# Patient Record
Sex: Female | Born: 1970
Health system: Southern US, Community
[De-identification: ages and names within clinical notes are randomized; demographics above are authoritative.]

## PROBLEM LIST (undated history)

## (undated) DIAGNOSIS — T7840XA Allergy, unspecified, initial encounter: Secondary | ICD-10-CM

## (undated) DIAGNOSIS — F419 Anxiety disorder, unspecified: Secondary | ICD-10-CM

## (undated) DIAGNOSIS — D649 Anemia, unspecified: Secondary | ICD-10-CM

## (undated) DIAGNOSIS — F32A Depression, unspecified: Secondary | ICD-10-CM

## (undated) DIAGNOSIS — Z803 Family history of malignant neoplasm of breast: Secondary | ICD-10-CM

## (undated) DIAGNOSIS — Z800 Family history of malignant neoplasm of digestive organs: Secondary | ICD-10-CM

## (undated) DIAGNOSIS — F329 Major depressive disorder, single episode, unspecified: Secondary | ICD-10-CM

## (undated) HISTORY — DX: Depression, unspecified: F32.A

## (undated) HISTORY — DX: Anxiety disorder, unspecified: F41.9

## (undated) HISTORY — DX: Major depressive disorder, single episode, unspecified: F32.9

## (undated) HISTORY — DX: Allergy, unspecified, initial encounter: T78.40XA

## (undated) HISTORY — DX: Anemia, unspecified: D64.9

## (undated) HISTORY — DX: Family history of malignant neoplasm of digestive organs: Z80.0

## (undated) HISTORY — DX: Family history of malignant neoplasm of breast: Z80.3

---

## 2001-11-01 ENCOUNTER — Other Ambulatory Visit: Admission: RE | Admit: 2001-11-01 | Discharge: 2001-11-01 | Payer: Self-pay | Admitting: *Deleted

## 2003-04-02 ENCOUNTER — Encounter: Admission: RE | Admit: 2003-04-02 | Discharge: 2003-04-02 | Payer: Self-pay

## 2004-12-29 ENCOUNTER — Other Ambulatory Visit: Admission: RE | Admit: 2004-12-29 | Discharge: 2004-12-29 | Payer: Self-pay | Admitting: Family Medicine

## 2007-02-15 ENCOUNTER — Encounter: Admission: RE | Admit: 2007-02-15 | Discharge: 2007-02-15 | Payer: Self-pay | Admitting: Family Medicine

## 2007-08-10 ENCOUNTER — Encounter: Admission: RE | Admit: 2007-08-10 | Discharge: 2007-08-10 | Payer: Self-pay | Admitting: Family Medicine

## 2008-11-12 ENCOUNTER — Other Ambulatory Visit: Admission: RE | Admit: 2008-11-12 | Discharge: 2008-11-12 | Payer: Self-pay | Admitting: Family Medicine

## 2010-04-06 ENCOUNTER — Other Ambulatory Visit: Admission: RE | Admit: 2010-04-06 | Discharge: 2010-04-06 | Payer: Self-pay | Admitting: Family Medicine

## 2015-07-20 ENCOUNTER — Ambulatory Visit (INDEPENDENT_AMBULATORY_CARE_PROVIDER_SITE_OTHER): Payer: 59 | Admitting: Emergency Medicine

## 2015-07-20 VITALS — BP 108/58 | HR 51 | Temp 98.1°F | Resp 18 | Ht 66.5 in | Wt 191.2 lb

## 2015-07-20 DIAGNOSIS — F329 Major depressive disorder, single episode, unspecified: Secondary | ICD-10-CM | POA: Diagnosis not present

## 2015-07-20 DIAGNOSIS — F4323 Adjustment disorder with mixed anxiety and depressed mood: Secondary | ICD-10-CM

## 2015-07-20 DIAGNOSIS — F32A Depression, unspecified: Secondary | ICD-10-CM

## 2015-07-20 DIAGNOSIS — Z0000 Encounter for general adult medical examination without abnormal findings: Secondary | ICD-10-CM | POA: Diagnosis not present

## 2015-07-20 DIAGNOSIS — L989 Disorder of the skin and subcutaneous tissue, unspecified: Secondary | ICD-10-CM | POA: Diagnosis not present

## 2015-07-20 DIAGNOSIS — E049 Nontoxic goiter, unspecified: Secondary | ICD-10-CM | POA: Diagnosis not present

## 2015-07-20 DIAGNOSIS — Z13220 Encounter for screening for lipoid disorders: Secondary | ICD-10-CM

## 2015-07-20 DIAGNOSIS — R001 Bradycardia, unspecified: Secondary | ICD-10-CM | POA: Diagnosis not present

## 2015-07-20 LAB — COMPLETE METABOLIC PANEL WITH GFR
ALBUMIN: 4.3 g/dL (ref 3.6–5.1)
ALK PHOS: 64 U/L (ref 33–115)
ALT: 9 U/L (ref 6–29)
AST: 11 U/L (ref 10–35)
BUN: 17 mg/dL (ref 7–25)
CALCIUM: 9 mg/dL (ref 8.6–10.2)
CO2: 28 mmol/L (ref 20–31)
CREATININE: 0.86 mg/dL (ref 0.50–1.10)
Chloride: 102 mmol/L (ref 98–110)
GFR, EST NON AFRICAN AMERICAN: 82 mL/min (ref >=60)
Glucose, Bld: 90 mg/dL (ref 65–99)
Potassium: 5 mmol/L (ref 3.5–5.3)
SODIUM: 136 mmol/L (ref 135–146)
TOTAL PROTEIN: 6.7 g/dL (ref 6.1–8.1)
Total Bilirubin: 0.4 mg/dL (ref 0.2–1.2)

## 2015-07-20 LAB — LIPID PANEL
Cholesterol: 195 mg/dL (ref 125–200)
HDL: 51 mg/dL (ref >=46)
LDL CALC: 129 mg/dL (ref <130)
Total CHOL/HDL Ratio: 3.8 Ratio (ref <=5.0)
Triglycerides: 76 mg/dL (ref <150)
VLDL: 15 mg/dL (ref <30)

## 2015-07-20 LAB — POCT CBC
GRANULOCYTE PERCENT: 59.9 % (ref 37–80)
HCT, POC: 41.3 % (ref 37.7–47.9)
HEMOGLOBIN: 14.3 g/dL (ref 12.2–16.2)
Lymph, poc: 2 (ref 0.6–3.4)
MCH: 30.6 pg (ref 27–31.2)
MCHC: 34.5 g/dL (ref 31.8–35.4)
MCV: 88.5 fL (ref 80–97)
MID (cbc): 0.4 (ref 0–0.9)
MPV: 7.5 fL (ref 0–99.8)
PLATELET COUNT, POC: 247 10*3/uL (ref 142–424)
POC Granulocyte: 3.7 (ref 2–6.9)
POC LYMPH %: 33.2 % (ref 10–50)
POC MID %: 6.9 %M (ref 0–12)
RBC: 4.66 M/uL (ref 4.04–5.48)
RDW, POC: 13.3 %
WBC: 6.1 10*3/uL (ref 4.6–10.2)

## 2015-07-20 LAB — TSH: TSH: 2.1 u[IU]/mL (ref 0.350–4.500)

## 2015-07-20 NOTE — Progress Notes (Signed)
Patient ID: Sylvia Meyer, female   DOB: 07/21/1970, 45 y.o.   MRN: [id number]     By signing my name below, I, Zola Button, attest that this documentation has been prepared under the direction and in the presence of Arlyss Queen, MD.  Electronically Signed: Zola Button, Medical Scribe. 07/20/2015. 9:19 AM.   Chief Complaint:  Chief Complaint  Patient presents with  . Annual Exam    School Physical    HPI: Sylvia Meyer is a 45 y.o. female who reports to [facility] today for a physical exam for school. 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  Patient had her gyn exams in the past few months, but she has not had recent blood work.  Patient notes she has had a small bump beneath the skin to her left shoulder, left thigh, and right ankle for the past 3 years approximately. She describes these areas as "seeds." However, these areas have grown in size over the past few months. She has also had a nevus to her left foot for the past 2 years. She did go to dermatology a few years ago and was given a cream, but this did not help.  Patient notes she has had a scar on her left pupil since she was 1. The vision in her eye is not correctable. She does have reading glasses.  She takes Wellbutrin for anxiety and mild depression. She has been doing well with this.  Past Medical History  Diagnosis Date  . Allergy   . Anemia   . Depression    History reviewed. No pertinent past surgical history. Social History   Social History  . Marital Status: Single    Spouse Name: N/A  . Number of Children: N/A  . Years of Education: N/A   Social History Main Topics  . Smoking status: Never Smoker   . Smokeless tobacco: None  . Alcohol Use: None  . Drug Use: None  . Sexual Activity: Not Asked   Other Topics Concern  . None   Social History Narrative  . None   Family History  Problem Relation Age of Onset  . Cancer  Mother   . Mental illness Mother   . Hyperlipidemia Mother   . Depression Father   . Heart disease Father   . Hyperlipidemia Father   . Hypertension Father   . Stroke Father    No Known Allergies Prior to Admission medications   Medication Sig Start Date End Date Taking? Authorizing Provider  buPROPion (WELLBUTRIN XL) 150 MG 24 hr tablet Take 150 mg by mouth daily.   Yes Historical Provider, MD     ROS: The patient denies fevers, chills, night sweats, unintentional weight loss, chest pain, palpitations, wheezing, dyspnea on exertion, nausea, vomiting, abdominal pain, dysuria, hematuria, melena, numbness, weakness, or tingling.  All other systems have been reviewed and were otherwise negative with the exception of those mentioned in the HPI and as above.    PHYSICAL EXAM: Filed Vitals:   07/20/15 0902  BP: 108/58  Pulse: 51  Temp: 98.1 F (36.7 C)  Resp: 18   Body mass index is 30.4 kg/(m^2).   General: Alert, no acute distress HEENT:  Normocephalic, atraumatic, oropharynx patent. Neck: There is mild assymmetry of the thyroid, right lobe slightly larger than left. Eye: Juliette Mangle [facility] [place] Cardiovascular:  Regular rate and rhythm, no rubs murmurs or gallops.  No Carotid bruits,  radial pulse intact. No pedal edema.  Respiratory: Clear to auscultation bilaterally.  No wheezes, rales, or rhonchi.  No cyanosis, no use of accessory musculature Abdominal: No organomegaly, abdomen is soft and non-tender, positive bowel sounds.  No masses. Musculoskeletal: Gait intact. No edema, tenderness Skin: There are three 0.4-0.6 cm firm areas beneath the skin, left upper arm, left upper leg, and right ankle. Neurologic: Facial musculature symmetric. Psychiatric: Patient acts appropriately throughout our interaction. Lymphatic: No cervical or submandibular lymphadenopathy Genitourinary: Breast and gyn exam deferred, done previously by her gynecologist.     LABS:  Results for orders placed or  performed in visit on 07/20/15  POCT CBC  Result Value Ref Range   WBC 6.1 4.6 - 10.2 K/uL   Lymph, poc 2.0 0.6 - 3.4   POC LYMPH PERCENT 33.2 10 - 50 %L   MID (cbc) 0.4 0 - 0.9   POC MID % 6.9 0 - 12 %M   POC Granulocyte 3.7 2 - 6.9   Granulocyte percent 59.9 37 - 80 %G   RBC 4.66 4.04 - 5.48 M/uL   Hemoglobin 14.3 12.2 - 16.2 g/dL   HCT, POC 41.3 37.7 - 47.9 %   MCV 88.5 80 - 97 fL   MCH, POC 30.6 27 - 31.2 pg   MCHC 34.5 31.8 - 35.4 g/dL   RDW, POC 13.3 %   Platelet Count, POC 247 142 - 424 K/uL   MPV 7.5 0 - 99.8 fL    EKG/XRAY:   Primary read interpreted by Dr. Everlene Farrier at [facility]. Sinus bradycardia and no acute changes.   ASSESSMENT/PLAN: Patient cleared to attend school at [employer].I personally performed the services described in this documentation, which was scribed in my presence. The recorded information has been reviewed and is accurate.    Gross sideeffects, risk and benefits, and alternatives of medications d/w patient. Patient is aware that all medications have potential sideeffects and we are unable to predict every sideeffect or drug-drug interaction that may occur.  Arlyss Queen MD 07/20/2015 9:19 AM

## 2015-07-20 NOTE — Patient Instructions (Signed)
Health Maintenance, Female Adopting a healthy lifestyle and getting preventive care can go a long way to promote health and wellness. Talk with your health care provider about what schedule of regular examinations is right for you. This is a good chance for you to check in with your provider about disease prevention and staying healthy. In between checkups, there are plenty of things you can do on your own. Experts have done a lot of research about which lifestyle changes and preventive measures are most likely to keep you healthy. Ask your health care provider for more information. WEIGHT AND DIET  Eat a healthy diet  Be sure to include plenty of vegetables, fruits, low-fat dairy products, and lean protein.  Do not eat a lot of foods high in solid fats, added sugars, or salt.  Get regular exercise. This is one of the most important things you can do for your health.  Most adults should exercise for at least 150 minutes each week. The exercise should increase your heart rate and make you sweat (moderate-intensity exercise).  Most adults should also do strengthening exercises at least twice a week. This is in addition to the moderate-intensity exercise.  Maintain a healthy weight  Body mass index (BMI) is a measurement that can be used to identify possible weight problems. It estimates body fat based on height and weight. Your health care provider can help determine your BMI and help you achieve or maintain a healthy weight.  For females 104 years of age and older:   A BMI below 18.5 is considered underweight.  A BMI of 18.5 to 24.9 is normal.  A BMI of 25 to 29.9 is considered overweight.  A BMI of 30 and above is considered obese.  Watch levels of cholesterol and blood lipids  You should start having your blood tested for lipids and cholesterol at 46 years of age, then have this test every 5 years.  You may need to have your cholesterol levels checked more often if:  Your lipid  or cholesterol levels are high.  You are older than 46 years of age.  You are at high risk for heart disease.  CANCER SCREENING   Lung Cancer  Lung cancer screening is recommended for adults 54-18 years old who are at high risk for lung cancer because of a history of smoking.  A yearly low-dose CT scan of the lungs is recommended for people who:  Currently smoke.  Have quit within the past 15 years.  Have at least a 30-pack-year history of smoking. A pack year is smoking an average of one pack of cigarettes a day for 1 year.  Yearly screening should continue until it has been 15 years since you quit.  Yearly screening should stop if you develop a health problem that would prevent you from having lung cancer treatment.  Breast Cancer  Practice breast self-awareness. This means understanding how your breasts normally appear and feel.  It also means doing regular breast self-exams. Let your health care provider know about any changes, no matter how small.  If you are in your 20s or 30s, you should have a clinical breast exam (CBE) by a health care provider every 1-3 years as part of a regular health exam.  If you are 64 or older, have a CBE every year. Also consider having a breast X-ray (mammogram) every year.  If you have a family history of breast cancer, talk to your health care provider about genetic screening.  If you  are at high risk for breast cancer, talk to your health care provider about having an MRI and a mammogram every year.  Breast cancer gene (BRCA) assessment is recommended for women who have family members with BRCA-related cancers. BRCA-related cancers include:  Breast.  Ovarian.  Tubal.  Peritoneal cancers.  Results of the assessment will determine the need for genetic counseling and BRCA1 and BRCA2 testing. Cervical Cancer Your health care provider may recommend that you be screened regularly for cancer of the pelvic organs (ovaries, uterus, and  vagina). This screening involves a pelvic examination, including checking for microscopic changes to the surface of your cervix (Pap test). You may be encouraged to have this screening done every 3 years, beginning at age 52.  For women ages 64-65, health care providers may recommend pelvic exams and Pap testing every 3 years, or they may recommend the Pap and pelvic exam, combined with testing for human papilloma virus (HPV), every 5 years. Some types of HPV increase your risk of cervical cancer. Testing for HPV may also be done on women of any age with unclear Pap test results.  Other health care providers may not recommend any screening for nonpregnant women who are considered low risk for pelvic cancer and who do not have symptoms. Ask your health care provider if a screening pelvic exam is right for you.  If you have had past treatment for cervical cancer or a condition that could lead to cancer, you need Pap tests and screening for cancer for at least 20 years after your treatment. If Pap tests have been discontinued, your risk factors (such as having a new sexual partner) need to be reassessed to determine if screening should resume. Some women have medical problems that increase the chance of getting cervical cancer. In these cases, your health care provider may recommend more frequent screening and Pap tests. Colorectal Cancer  This type of cancer can be detected and often prevented.  Routine colorectal cancer screening usually begins at 46 years of age and continues through 46 years of age.  Your health care provider may recommend screening at an earlier age if you have risk factors for colon cancer.  Your health care provider may also recommend using home test kits to check for hidden blood in the stool.  A small camera at the end of a tube can be used to examine your colon directly (sigmoidoscopy or colonoscopy). This is done to check for the earliest forms of colorectal  cancer.  Routine screening usually begins at age 58.  Direct examination of the colon should be repeated every 5-10 years through 46 years of age. However, you may need to be screened more often if early forms of precancerous polyps or small growths are found. Skin Cancer  Check your skin from head to toe regularly.  Tell your health care provider about any new moles or changes in moles, especially if there is a change in a mole's shape or color.  Also tell your health care provider if you have a mole that is larger than the size of a pencil eraser.  Always use sunscreen. Apply sunscreen liberally and repeatedly throughout the day.  Protect yourself by wearing long sleeves, pants, a wide-brimmed hat, and sunglasses whenever you are outside. HEART DISEASE, DIABETES, AND HIGH BLOOD PRESSURE   High blood pressure causes heart disease and increases the risk of stroke. High blood pressure is more likely to develop in:  People who have blood pressure in the high end  of the normal range (130-139/85-89 mm Hg).  People who are overweight or obese.  People who are African American.  If you are 38-23 years of age, have your blood pressure checked every 3-5 years. If you are 61 years of age or older, have your blood pressure checked every year. You should have your blood pressure measured twice--once when you are at a hospital or clinic, and once when you are not at a hospital or clinic. Record the average of the two measurements. To check your blood pressure when you are not at a hospital or clinic, you can use:  An automated blood pressure machine at a pharmacy.  A home blood pressure monitor.  If you are between 45 years and 39 years old, ask your health care provider if you should take aspirin to prevent strokes.  Have regular diabetes screenings. This involves taking a blood sample to check your fasting blood sugar level.  If you are at a normal weight and have a low risk for diabetes,  have this test once every three years after 46 years of age.  If you are overweight and have a high risk for diabetes, consider being tested at a younger age or more often. PREVENTING INFECTION  Hepatitis B  If you have a higher risk for hepatitis B, you should be screened for this virus. You are considered at high risk for hepatitis B if:  You were born in a country where hepatitis B is common. Ask your health care provider which countries are considered high risk.  Your parents were born in a high-risk country, and you have not been immunized against hepatitis B (hepatitis B vaccine).  You have HIV or AIDS.  You use needles to inject street drugs.  You live with someone who has hepatitis B.  You have had sex with someone who has hepatitis B.  You get hemodialysis treatment.  You take certain medicines for conditions, including cancer, organ transplantation, and autoimmune conditions. Hepatitis C  Blood testing is recommended for:  Everyone born from [birth year] through 1966.  Anyone with known risk factors for hepatitis C. Sexually transmitted infections (STIs)  You should be screened for sexually transmitted infections (STIs) including gonorrhea and chlamydia if:  You are sexually active and are younger than 46 years of age.  You are older than 46 years of age and your health care provider tells you that you are at risk for this type of infection.  Your sexual activity has changed since you were last screened and you are at an increased risk for chlamydia or gonorrhea. Ask your health care provider if you are at risk.  If you do not have HIV, but are at risk, it may be recommended that you take a prescription medicine daily to prevent HIV infection. This is called pre-exposure prophylaxis (PrEP). You are considered at risk if:  You are sexually active and do not regularly use condoms or know the HIV status of your partner(s).  You take drugs by injection.  You are sexually  active with a partner who has HIV. Talk with your health care provider about whether you are at high risk of being infected with HIV. If you choose to begin PrEP, you should first be tested for HIV. You should then be tested every 3 months for as long as you are taking PrEP.  PREGNANCY   If you are premenopausal and you may become pregnant, ask your health care provider about preconception counseling.  If you may  become pregnant, take 400 to 800 micrograms (mcg) of folic acid every day.  If you want to prevent pregnancy, talk to your health care provider about birth control (contraception). OSTEOPOROSIS AND MENOPAUSE   Osteoporosis is a disease in which the bones lose minerals and strength with aging. This can result in serious bone fractures. Your risk for osteoporosis can be identified using a bone density scan.  If you are 61 years of age or older, or if you are at risk for osteoporosis and fractures, ask your health care provider if you should be screened.  Ask your health care provider whether you should take a calcium or vitamin D supplement to lower your risk for osteoporosis.  Menopause may have certain physical symptoms and risks.  Hormone replacement therapy may reduce some of these symptoms and risks. Talk to your health care provider about whether hormone replacement therapy is right for you.  HOME CARE INSTRUCTIONS   Schedule regular health, dental, and eye exams.  Stay current with your immunizations.   Do not use any tobacco products including cigarettes, chewing tobacco, or electronic cigarettes.  If you are pregnant, do not drink alcohol.  If you are breastfeeding, limit how much and how often you drink alcohol.  Limit alcohol intake to no more than 1 drink per day for nonpregnant women. One drink equals 12 ounces of beer, 5 ounces of wine, or 1 ounces of hard liquor.  Do not use street drugs.  Do not share needles.  Ask your health care provider for help if  you need support or information about quitting drugs.  Tell your health care provider if you often feel depressed.  Tell your health care provider if you have ever been abused or do not feel safe at home.   This information is not intended to replace advice given to you by your health care provider. Make sure you discuss any questions you have with your health care provider.   Document Released: 12/14/2010 Document Revised: 06/21/2014 Document Reviewed: 05/02/2013 Elsevier Interactive Patient Education [employer].

## 2015-07-29 ENCOUNTER — Other Ambulatory Visit: Payer: Self-pay

## 2015-07-29 DIAGNOSIS — Z1231 Encounter for screening mammogram for malignant neoplasm of breast: Secondary | ICD-10-CM

## 2015-07-30 ENCOUNTER — Telehealth: Payer: Self-pay | Admitting: *Deleted

## 2015-07-30 NOTE — Telephone Encounter (Signed)
Notes Recorded by Burnis Kingfisher, CMA on 07/23/2015 at 10:41 AM Lab results were left on voicemail and advised to call back regarding thyroid ultrasound. Notes Recorded by Darlyne Russian, MD on 07/20/2015 at 5:19 PM Call patient let her know her labs are normal. Also let her know I felt there could be some asymmetry on her thyroid gland on exam and I scheduled her for a thyroid ultrasound.  Pt had left message on lab voicemail Lmom for pt to cb

## 2015-08-05 ENCOUNTER — Ambulatory Visit
Admission: RE | Admit: 2015-08-05 | Discharge: 2015-08-05 | Disposition: A | Discharge status: Home / Self Care | Payer: 59 | Source: Ambulatory Visit | Attending: Emergency Medicine | Admitting: Emergency Medicine

## 2015-08-05 DIAGNOSIS — E0590 Thyrotoxicosis, unspecified without thyrotoxic crisis or storm: Secondary | ICD-10-CM | POA: Diagnosis not present

## 2015-08-05 DIAGNOSIS — E049 Nontoxic goiter, unspecified: Secondary | ICD-10-CM

## 2015-08-08 ENCOUNTER — Encounter: Payer: Self-pay | Admitting: Emergency Medicine

## 2015-08-11 NOTE — Telephone Encounter (Signed)
Sent pt a mychart message about ultrasound results.

## 2015-08-15 ENCOUNTER — Ambulatory Visit (INDEPENDENT_AMBULATORY_CARE_PROVIDER_SITE_OTHER): Payer: 59 | Admitting: Physician Assistant

## 2015-08-15 VITALS — BP 106/64 | HR 76 | Temp 98.5°F | Resp 16 | Ht 65.5 in | Wt 191.0 lb

## 2015-08-15 DIAGNOSIS — R21 Rash and other nonspecific skin eruption: Secondary | ICD-10-CM

## 2015-08-15 MED ORDER — FLUTICASONE PROPIONATE 50 MCG/ACT NA SUSP: 2.0000 | Freq: Every day | NASAL | Status: DC

## 2015-08-15 MED ORDER — PREDNISONE 20 MG PO TABS: ORAL_TABLET | ORAL | Status: DC

## 2015-08-15 NOTE — Patient Instructions (Signed)
Continue to do the pepcid and restart the benadryl if the prednisone is not working well. If your symptoms do not improve in one week, please return.  Rash A rash is a change in the color or texture of the skin. There are many different types of rashes. You may have other problems that accompany your rash. CAUSES   Infections.  Allergic reactions. This can include allergies to pets or foods.  Certain medicines.  Exposure to certain chemicals, soaps, or cosmetics.  Heat.  Exposure to poisonous plants.  Tumors, both cancerous and noncancerous. SYMPTOMS   Redness.  Scaly skin.  Itchy skin.  Dry or cracked skin.  Bumps.  Blisters.  Pain. DIAGNOSIS  Your caregiver may do a physical exam to determine what type of rash you have. A skin sample (biopsy) may be taken and examined under a microscope. TREATMENT  Treatment depends on the type of rash you have. Your caregiver may prescribe certain medicines. For serious conditions, you may need to see a skin doctor (dermatologist). HOME CARE INSTRUCTIONS   Avoid the substance that caused your rash.  Do not scratch your rash. This can cause infection.  You may take cool baths to help stop itching.  Only take over-the-counter or prescription medicines as directed by your caregiver.  Keep all follow-up appointments as directed by your caregiver. SEEK IMMEDIATE MEDICAL CARE IF:  You have increasing pain, swelling, or redness.  You have a fever.  You have new or severe symptoms.  You have body aches, diarrhea, or vomiting.  Your rash is not better after 3 days. MAKE SURE YOU:  Understand these instructions.  Will watch your condition.  Will get help right away if you are not doing well or get worse.   This information is not intended to replace advice given to you by your health care provider. Make sure you discuss any questions you have with your health care provider.   Document Released: 05/21/2002 Document  Revised: 06/21/2014 Document Reviewed: 10/16/2014 Elsevier Interactive Patient Education [employer].

## 2015-08-15 NOTE — Progress Notes (Signed)
[facility] [address] [place] [address] [phone]  Date:  08/15/2015   Name:  Sylvia Meyer   DOB:  1970-08-26   MRN:  [id number]  PCP:  No primary care provider on file.    History of Present Illness:  Sylvia Meyer is a 45 y.o. female patient who presents to [facility] for pruritis.  Started 5 days ago, with pruritus all over.  She put hydrocortisone.  Next day, congestion, ear ache, sore throat, coughing.  She went back to work with dayquil, and generic guanifensin.  2 days ago, the rash became more pruritic then on bumps and legs.  No pain, no drainage.  No sob or dyspnea.   2 days ago, benadryl pepcid afternoon and last night which helped.   No fever.  She take wellbutrin for years.   Patient Active Problem List   Diagnosis Date Noted  . Adjustment disorder with mixed anxiety and depressed mood 07/20/2015    Past Medical History  Diagnosis Date  . Allergy   . Anemia   . Depression     History reviewed. No pertinent past surgical history.  Social History  Substance Use Topics  . Smoking status: Never Smoker   . Smokeless tobacco: None  . Alcohol Use: None    Family History  Problem Relation Age of Onset  . Cancer Mother   . Mental illness Mother   . Hyperlipidemia Mother   . Depression Father   . Heart disease Father   . Hyperlipidemia Father   . Hypertension Father   . Stroke Father     No Known Allergies  Medication list has been reviewed and updated.  Current Outpatient Prescriptions on File Prior to Visit  Medication Sig Dispense Refill  . buPROPion (WELLBUTRIN XL) 150 MG 24 hr tablet Take 150 mg by mouth daily.     No current facility-administered medications on file prior to visit.    ROS ROS otherwise unremarkable unless listed above.   Physical Examination: BP 106/64 mmHg  Pulse 76  Temp(Src) 98.5 F (36.9 C)  Resp 16  Ht 5' 5.5" (1.664 m)  Wt 191 lb (86.637 kg)  BMI 31.29 kg/m2  SpO2 98%  LMP  08/11/2015 Ideal Body Weight: Weight in (lb) to have BMI = 25: 152.2  Physical Exam  Constitutional: She is oriented to person, place, and time. She appears well-developed and well-nourished. No distress.  HENT:  Head: Normocephalic and atraumatic.  Right Ear: External ear normal.  Left Ear: External ear normal.  Eyes: Conjunctivae and EOM are normal. Pupils are equal, round, and reactive to light.  Cardiovascular: Normal rate.   Pulmonary/Chest: Effort normal. No respiratory distress.  Neurological: She is alert and oriented to person, place, and time.  Skin: Rash (papular rash along the legs and trunk.  non-tender without scaling.) noted. She is not diaphoretic.  Psychiatric: She has a normal mood and affect. Her behavior is normal.     Assessment and Plan: Sylvia Meyer is a 45 y.o. female who is here today for rash. Possible allergic reaction, possible viral exanthem. Will attempt prednisone taper.   Advised to continue h2blocker, and rtc in 1 week if no improvement.  Rash and nonspecific skin eruption - Plan: predniSONE (DELTASONE) 20 MG tablet, fluticasone (FLONASE) 50 MCG/ACT nasal spray, DISCONTINUED: predniSONE (DELTASONE) 20 MG tablet  Ivar Drape, PA-C [facility] 08/15/2015 7:06 PM

## 2015-09-16 ENCOUNTER — Ambulatory Visit
Admission: RE | Admit: 2015-09-16 | Discharge: 2015-09-16 | Disposition: A | Discharge status: Home / Self Care | Payer: 59 | Source: Ambulatory Visit

## 2015-09-16 ENCOUNTER — Ambulatory Visit: Payer: Self-pay

## 2015-09-16 DIAGNOSIS — Z1231 Encounter for screening mammogram for malignant neoplasm of breast: Secondary | ICD-10-CM

## 2015-09-16 DIAGNOSIS — L309 Dermatitis, unspecified: Secondary | ICD-10-CM | POA: Diagnosis not present

## 2015-09-16 DIAGNOSIS — H524 Presbyopia: Secondary | ICD-10-CM | POA: Diagnosis not present

## 2015-09-16 MED FILL — TRIAMCINOLONE 0.1% CREAM: 0.1 | 14 days supply | Qty: 454 | Fill #0

## 2015-09-17 ENCOUNTER — Other Ambulatory Visit: Payer: Self-pay | Admitting: Internal Medicine

## 2015-09-17 DIAGNOSIS — R928 Other abnormal and inconclusive findings on diagnostic imaging of breast: Secondary | ICD-10-CM

## 2015-09-22 MED FILL — BUPROPION HCL XL 150 MG TAB: 150 | 30 days supply | Qty: 30 | Fill #0

## 2015-09-24 ENCOUNTER — Ambulatory Visit
Admission: RE | Admit: 2015-09-24 | Discharge: 2015-09-24 | Disposition: A | Discharge status: Home / Self Care | Payer: 59 | Source: Ambulatory Visit | Attending: Internal Medicine | Admitting: Internal Medicine

## 2015-09-24 DIAGNOSIS — N6011 Diffuse cystic mastopathy of right breast: Secondary | ICD-10-CM | POA: Diagnosis not present

## 2015-09-24 DIAGNOSIS — R928 Other abnormal and inconclusive findings on diagnostic imaging of breast: Secondary | ICD-10-CM

## 2015-10-14 DIAGNOSIS — B078 Other viral warts: Secondary | ICD-10-CM | POA: Diagnosis not present

## 2015-10-14 DIAGNOSIS — L728 Other follicular cysts of the skin and subcutaneous tissue: Secondary | ICD-10-CM | POA: Diagnosis not present

## 2015-10-14 DIAGNOSIS — D2362 Other benign neoplasm of skin of left upper limb, including shoulder: Secondary | ICD-10-CM | POA: Diagnosis not present

## 2015-10-14 DIAGNOSIS — D2372 Other benign neoplasm of skin of left lower limb, including hip: Secondary | ICD-10-CM | POA: Diagnosis not present

## 2015-10-27 DIAGNOSIS — D485 Neoplasm of uncertain behavior of skin: Secondary | ICD-10-CM | POA: Diagnosis not present

## 2015-10-30 DIAGNOSIS — D485 Neoplasm of uncertain behavior of skin: Secondary | ICD-10-CM | POA: Diagnosis not present

## 2015-11-20 DIAGNOSIS — D485 Neoplasm of uncertain behavior of skin: Secondary | ICD-10-CM | POA: Diagnosis not present

## 2015-12-08 MED FILL — BUPROPION HCL XL 150 MG TAB: 150 | 14 days supply | Qty: 14 | Fill #0

## 2015-12-11 DIAGNOSIS — E669 Obesity, unspecified: Secondary | ICD-10-CM | POA: Diagnosis not present

## 2015-12-11 DIAGNOSIS — F329 Major depressive disorder, single episode, unspecified: Secondary | ICD-10-CM | POA: Diagnosis not present

## 2015-12-11 DIAGNOSIS — E785 Hyperlipidemia, unspecified: Secondary | ICD-10-CM | POA: Diagnosis not present

## 2015-12-24 MED FILL — BUPROPION HCL XL 150 MG TAB: 150 | 90 days supply | Qty: 90 | Fill #0

## 2016-04-20 MED FILL — BUPROPION HCL XL 150 MG TAB: 150 | 90 days supply | Qty: 90 | Fill #1

## 2016-07-21 DIAGNOSIS — E785 Hyperlipidemia, unspecified: Secondary | ICD-10-CM | POA: Diagnosis not present

## 2016-07-21 DIAGNOSIS — Z0000 Encounter for general adult medical examination without abnormal findings: Secondary | ICD-10-CM | POA: Diagnosis not present

## 2016-09-17 DIAGNOSIS — H5203 Hypermetropia, bilateral: Secondary | ICD-10-CM | POA: Diagnosis not present

## 2016-09-17 DIAGNOSIS — H52223 Regular astigmatism, bilateral: Secondary | ICD-10-CM | POA: Diagnosis not present

## 2016-09-17 DIAGNOSIS — H524 Presbyopia: Secondary | ICD-10-CM | POA: Diagnosis not present

## 2016-10-20 ENCOUNTER — Other Ambulatory Visit: Payer: Self-pay | Admitting: Internal Medicine

## 2016-10-20 DIAGNOSIS — Z1231 Encounter for screening mammogram for malignant neoplasm of breast: Secondary | ICD-10-CM

## 2016-11-02 ENCOUNTER — Ambulatory Visit
Admission: RE | Admit: 2016-11-02 | Discharge: 2016-11-02 | Disposition: A | Discharge status: Home / Self Care | Payer: 59 | Source: Ambulatory Visit | Attending: Internal Medicine | Admitting: Internal Medicine

## 2016-11-02 DIAGNOSIS — Z1231 Encounter for screening mammogram for malignant neoplasm of breast: Secondary | ICD-10-CM

## 2016-11-03 ENCOUNTER — Other Ambulatory Visit: Payer: Self-pay | Admitting: Internal Medicine

## 2016-11-03 DIAGNOSIS — R928 Other abnormal and inconclusive findings on diagnostic imaging of breast: Secondary | ICD-10-CM

## 2016-11-05 ENCOUNTER — Ambulatory Visit
Admission: RE | Admit: 2016-11-05 | Discharge: 2016-11-05 | Disposition: A | Discharge status: Home / Self Care | Payer: 59 | Source: Ambulatory Visit | Attending: Internal Medicine | Admitting: Internal Medicine

## 2016-11-05 DIAGNOSIS — R928 Other abnormal and inconclusive findings on diagnostic imaging of breast: Secondary | ICD-10-CM

## 2016-11-12 MED FILL — BUPROPION HCL XL 150 MG TAB: 150 | 90 days supply | Qty: 90 | Fill #2

## 2017-01-04 ENCOUNTER — Other Ambulatory Visit: Payer: Self-pay | Admitting: Obstetrics & Gynecology

## 2017-01-04 ENCOUNTER — Encounter: Payer: Self-pay | Admitting: Obstetrics & Gynecology

## 2017-01-04 ENCOUNTER — Ambulatory Visit (INDEPENDENT_AMBULATORY_CARE_PROVIDER_SITE_OTHER): Payer: 59 | Admitting: Obstetrics & Gynecology

## 2017-01-04 VITALS — BP 128/86 | Ht 65.0 in | Wt 180.0 lb

## 2017-01-04 DIAGNOSIS — Z01411 Encounter for gynecological examination (general) (routine) with abnormal findings: Secondary | ICD-10-CM

## 2017-01-04 DIAGNOSIS — Z3049 Encounter for surveillance of other contraceptives: Secondary | ICD-10-CM

## 2017-01-04 DIAGNOSIS — Z113 Encounter for screening for infections with a predominantly sexual mode of transmission: Secondary | ICD-10-CM

## 2017-01-04 DIAGNOSIS — L298 Other pruritus: Secondary | ICD-10-CM

## 2017-01-04 DIAGNOSIS — Z1151 Encounter for screening for human papillomavirus (HPV): Secondary | ICD-10-CM | POA: Diagnosis not present

## 2017-01-04 DIAGNOSIS — N898 Other specified noninflammatory disorders of vagina: Secondary | ICD-10-CM

## 2017-01-04 LAB — WET PREP FOR TRICH, YEAST, CLUE
Clue Cells Wet Prep HPF POC: NONE SEEN
TRICH WET PREP: NONE SEEN

## 2017-01-04 LAB — HEPATITIS B SURFACE ANTIGEN: HEP B S AG: NEGATIVE

## 2017-01-04 LAB — HEPATITIS C ANTIBODY: HCV AB: NEGATIVE

## 2017-01-04 NOTE — Patient Instructions (Signed)
1. Encounter for gynecological examination (general) (routine) with abnormal findings Gyn exam with increased vaginal d/c.  Pap/HPV done.  Breasts wnl.  Repeat screening mammo 2019.  2. Screen for STD (sexually transmitted disease) Asymptomatic. - HIV antibody - RPR - Hepatitis C Antibody - Hepatitis B Surface AntiGEN  3. Encounter for surveillance of condom contraception Condoms with spermicides recommended.  Declines other contraception at this time.  4. Vaginal itching Yeast vaginitis confirmed by wet prep.  No BV.  Will treat with Fluconazole 150 PO x 3 days.  Probiotic Tab or Suppository Intravaginally qweek as needed. - WET PREP FOR Braxton, YEAST, CLUE  Elmyra Ricks, it was a pleasure to meet you today!  I will inform you of your results as soon as available.

## 2017-01-04 NOTE — Progress Notes (Signed)
Sylvia Meyer 1971/04/14 [id number]   History:    46 y.o. G4P1A3 single.  Nurse at [employer].  Daughter is 31 yo.   RP:  New patient presenting for annual gyn exam   HPI:  Not currently sexually active, but recently met someone.  Will use condoms/Spermicides.  Menses regular normal every month.  No pelvic pain.  C/O frequent vaginal/vulvar itching that comes and goes.  Uses Vagisil on Vulva.  Wet preps pos for BV 2016.  Breasts wnl.  Walks her dog/Enjoys dancing.  Past medical history,surgical history, family history and social history were all reviewed and documented in the EPIC chart.  Gynecologic History Patient's last menstrual period was 12/16/2016. Contraception: condoms Last Pap: 2012. Results were: normal Last mammogram: 2018. Results were: normal  Obstetric History OB History  Gravida Para Term Preterm AB Living  '4 1     3 1  '$ SAB TAB Ectopic Multiple Live Births               # Outcome Date GA Lbr Len/2nd Weight Sex Delivery Anes PTL Lv  4 AB           3 AB           2 AB           1 Para                ROS: A ROS was performed and pertinent positives and negatives are included in the history.  GENERAL: No fevers or chills. HEENT: No change in vision, no earache, sore throat or sinus congestion. NECK: No pain or stiffness. CARDIOVASCULAR: No chest pain or pressure. No palpitations. PULMONARY: No shortness of breath, cough or wheeze. GASTROINTESTINAL: No abdominal pain, nausea, vomiting or diarrhea, melena or bright red blood per rectum. GENITOURINARY: No urinary frequency, urgency, hesitancy or dysuria. MUSCULOSKELETAL: No joint or muscle pain, no back pain, no recent trauma. DERMATOLOGIC: No rash, no itching, no lesions. ENDOCRINE: No polyuria, polydipsia, no heat or cold intolerance. No recent change in weight. HEMATOLOGICAL: No anemia or easy bruising or bleeding. NEUROLOGIC: No headache, seizures, numbness, tingling or weakness. PSYCHIATRIC: No  depression, no loss of interest in normal activity or change in sleep pattern.     Exam:   BP 128/86   Ht '5\' 5"'$  (1.651 m)   Wt 180 lb (81.6 kg)   LMP 12/16/2016   BMI 29.95 kg/m   Body mass index is 29.95 kg/m.  General appearance : Well developed well nourished female. No acute distress HEENT: Eyes: no retinal hemorrhage or exudates,  Neck supple, trachea midline, no carotid bruits, no thyroidmegaly Lungs: Clear to auscultation, no rhonchi or wheezes, or rib retractions  Heart: Regular rate and rhythm, no murmurs or gallops Breast:Examined in sitting and supine position were symmetrical in appearance, no palpable masses or tenderness,  no skin retraction, no nipple inversion, no nipple discharge, no skin discoloration, no axillary or supraclavicular lymphadenopathy Abdomen: no palpable masses or tenderness, no rebound or guarding Extremities: no edema or skin discoloration or tenderness  Pelvic:  Bartholin, Urethra, Skene Glands: Within normal limits             Vagina: No gross lesions.  Increased discharge c/w yeast.  Wet prep done.  Cervix: No gross lesions or discharge.  Pap/HPV HR/Gono-Chlam done.  Uterus  AV, normal size, shape and consistency, non-tender and mobile  Adnexa  Without masses or tenderness  Anus and perineum  normal  Assessment/Plan:  46 y.o. female for annual exam   1. Encounter for gynecological examination (general) (routine) with abnormal findings Gyn exam with increased vaginal d/c.  Pap/HPV done.  Breasts wnl.  Repeat screening mammo 2019.  2. Screen for STD (sexually transmitted disease) Asymptomatic. - HIV antibody - RPR - Hepatitis C Antibody - Hepatitis B Surface AntiGEN  3. Encounter for surveillance of condom contraception Condoms with spermicides recommended.  Declines other contraception at this time.  4. Vaginal itching Yeast vaginitis confirmed by wet prep.  No BV.  Will treat with Fluconazole 150 PO x 3 days.  Probiotic Tab or  Suppository Intravaginally qweek as needed. - WET PREP FOR [place], YEAST, CLUE  Counseling on above issues >50% x 10 minutes.  Princess Bruins MD, 11:59 AM 01/04/2017

## 2017-01-05 LAB — RPR

## 2017-01-05 LAB — HIV ANTIBODY (ROUTINE TESTING W REFLEX): HIV 1&2 Ab, 4th Generation: NONREACTIVE

## 2017-01-06 ENCOUNTER — Ambulatory Visit: Payer: 59 | Admitting: Obstetrics & Gynecology

## 2017-01-06 LAB — PAP IG, CT-NG NAA, HPV HIGH-RISK
Chlamydia Probe Amp: NOT DETECTED
GC PROBE AMP: NOT DETECTED
HPV DNA High Risk: DETECTED — AB

## 2017-01-10 LAB — HPV TYPE 16 AND 18/45 RNA
HPV TYPE 18/45 RNA: NOT DETECTED
HPV Type 16 RNA: NOT DETECTED

## 2017-01-17 ENCOUNTER — Telehealth: Payer: Self-pay | Admitting: *Deleted

## 2017-01-17 NOTE — Telephone Encounter (Signed)
Pt called stating pharmacy never received her Rx for diflucan 150 mg x 3 days for OV 01/04/17, I did confirm Rx in note,  Pt said you were told her you would put 2 refills on RX?

## 2017-01-18 MED ORDER — FLUCONAZOLE 150 MG PO TABS: 150.0000 mg | ORAL_TABLET | Freq: Every day | ORAL | 2 refills | Status: DC

## 2017-01-18 MED FILL — FLUCONAZOLE 150 MG TABLET: 150 | 3 days supply | Qty: 3 | Fill #0

## 2017-01-18 NOTE — Telephone Encounter (Signed)
Yes please send Diflucan generic 150 mg/tab 1 tab PO qd x 3 (3 tab with refill x 2).

## 2017-01-18 NOTE — Telephone Encounter (Signed)
Pt aware, Rx sent. 

## 2017-02-05 ENCOUNTER — Encounter: Payer: Self-pay | Admitting: Obstetrics & Gynecology

## 2017-03-03 MED FILL — buPROPion HCL ER (XL) 150 M: 150 | 90 days supply | Qty: 90 | Fill #0

## 2017-03-09 MED FILL — FLUCONAZOLE 150 MG TABLET: 150 | 3 days supply | Qty: 3 | Fill #1

## 2017-03-28 DIAGNOSIS — F330 Major depressive disorder, recurrent, mild: Secondary | ICD-10-CM | POA: Diagnosis not present

## 2017-04-15 DIAGNOSIS — F330 Major depressive disorder, recurrent, mild: Secondary | ICD-10-CM | POA: Diagnosis not present

## 2017-04-25 DIAGNOSIS — F330 Major depressive disorder, recurrent, mild: Secondary | ICD-10-CM | POA: Diagnosis not present

## 2017-05-04 DIAGNOSIS — M542 Cervicalgia: Secondary | ICD-10-CM | POA: Diagnosis not present

## 2017-05-04 DIAGNOSIS — M546 Pain in thoracic spine: Secondary | ICD-10-CM | POA: Diagnosis not present

## 2017-05-04 DIAGNOSIS — M545 Low back pain: Secondary | ICD-10-CM | POA: Diagnosis not present

## 2017-05-04 DIAGNOSIS — M9901 Segmental and somatic dysfunction of cervical region: Secondary | ICD-10-CM | POA: Diagnosis not present

## 2017-05-10 DIAGNOSIS — M542 Cervicalgia: Secondary | ICD-10-CM | POA: Diagnosis not present

## 2017-05-10 DIAGNOSIS — M546 Pain in thoracic spine: Secondary | ICD-10-CM | POA: Diagnosis not present

## 2017-05-10 DIAGNOSIS — M9901 Segmental and somatic dysfunction of cervical region: Secondary | ICD-10-CM | POA: Diagnosis not present

## 2017-05-10 DIAGNOSIS — M545 Low back pain: Secondary | ICD-10-CM | POA: Diagnosis not present

## 2017-05-13 DIAGNOSIS — F330 Major depressive disorder, recurrent, mild: Secondary | ICD-10-CM | POA: Diagnosis not present

## 2017-05-18 DIAGNOSIS — M542 Cervicalgia: Secondary | ICD-10-CM | POA: Diagnosis not present

## 2017-05-18 DIAGNOSIS — M9901 Segmental and somatic dysfunction of cervical region: Secondary | ICD-10-CM | POA: Diagnosis not present

## 2017-05-18 DIAGNOSIS — M545 Low back pain: Secondary | ICD-10-CM | POA: Diagnosis not present

## 2017-05-18 DIAGNOSIS — M546 Pain in thoracic spine: Secondary | ICD-10-CM | POA: Diagnosis not present

## 2017-05-20 DIAGNOSIS — F330 Major depressive disorder, recurrent, mild: Secondary | ICD-10-CM | POA: Diagnosis not present

## 2017-05-25 DIAGNOSIS — M545 Low back pain: Secondary | ICD-10-CM | POA: Diagnosis not present

## 2017-05-25 DIAGNOSIS — M9901 Segmental and somatic dysfunction of cervical region: Secondary | ICD-10-CM | POA: Diagnosis not present

## 2017-05-25 DIAGNOSIS — M542 Cervicalgia: Secondary | ICD-10-CM | POA: Diagnosis not present

## 2017-05-25 DIAGNOSIS — M546 Pain in thoracic spine: Secondary | ICD-10-CM | POA: Diagnosis not present

## 2017-05-27 DIAGNOSIS — F330 Major depressive disorder, recurrent, mild: Secondary | ICD-10-CM | POA: Diagnosis not present

## 2017-06-01 DIAGNOSIS — M545 Low back pain: Secondary | ICD-10-CM | POA: Diagnosis not present

## 2017-06-01 DIAGNOSIS — M546 Pain in thoracic spine: Secondary | ICD-10-CM | POA: Diagnosis not present

## 2017-06-01 DIAGNOSIS — M9901 Segmental and somatic dysfunction of cervical region: Secondary | ICD-10-CM | POA: Diagnosis not present

## 2017-06-01 DIAGNOSIS — M542 Cervicalgia: Secondary | ICD-10-CM | POA: Diagnosis not present

## 2017-06-29 DIAGNOSIS — F330 Major depressive disorder, recurrent, mild: Secondary | ICD-10-CM | POA: Diagnosis not present

## 2017-07-18 DIAGNOSIS — F330 Major depressive disorder, recurrent, mild: Secondary | ICD-10-CM | POA: Diagnosis not present

## 2017-07-19 MED FILL — buPROPion HCL ER (XL) 150 M: 150 | 90 days supply | Qty: 90 | Fill #1

## 2017-08-30 ENCOUNTER — Other Ambulatory Visit (INDEPENDENT_AMBULATORY_CARE_PROVIDER_SITE_OTHER): Payer: 59

## 2017-08-30 ENCOUNTER — Encounter: Payer: Self-pay | Admitting: Nurse Practitioner

## 2017-08-30 ENCOUNTER — Ambulatory Visit (INDEPENDENT_AMBULATORY_CARE_PROVIDER_SITE_OTHER): Payer: 59 | Admitting: Nurse Practitioner

## 2017-08-30 ENCOUNTER — Other Ambulatory Visit: Payer: Self-pay

## 2017-08-30 VITALS — BP 110/64 | HR 63 | Temp 99.0°F | Resp 16 | Ht 65.0 in | Wt 194.0 lb

## 2017-08-30 DIAGNOSIS — M79604 Pain in right leg: Secondary | ICD-10-CM | POA: Diagnosis not present

## 2017-08-30 DIAGNOSIS — Z13220 Encounter for screening for lipoid disorders: Secondary | ICD-10-CM | POA: Diagnosis not present

## 2017-08-30 DIAGNOSIS — J309 Allergic rhinitis, unspecified: Secondary | ICD-10-CM | POA: Diagnosis not present

## 2017-08-30 DIAGNOSIS — M79605 Pain in left leg: Secondary | ICD-10-CM

## 2017-08-30 DIAGNOSIS — Z0001 Encounter for general adult medical examination with abnormal findings: Secondary | ICD-10-CM

## 2017-08-30 DIAGNOSIS — F4323 Adjustment disorder with mixed anxiety and depressed mood: Secondary | ICD-10-CM

## 2017-08-30 DIAGNOSIS — I83893 Varicose veins of bilateral lower extremities with other complications: Secondary | ICD-10-CM

## 2017-08-30 DIAGNOSIS — E669 Obesity, unspecified: Secondary | ICD-10-CM

## 2017-08-30 DIAGNOSIS — E559 Vitamin D deficiency, unspecified: Secondary | ICD-10-CM

## 2017-08-30 LAB — COMPREHENSIVE METABOLIC PANEL
ALBUMIN: 4.5 g/dL (ref 3.5–5.2)
ALT: 10 U/L (ref 0–35)
AST: 11 U/L (ref 0–37)
Alkaline Phosphatase: 62 U/L (ref 39–117)
BILIRUBIN TOTAL: 0.5 mg/dL (ref 0.2–1.2)
BUN: 15 mg/dL (ref 6–23)
CALCIUM: 9.3 mg/dL (ref 8.4–10.5)
CO2: 28 mEq/L (ref 19–32)
Chloride: 101 mEq/L (ref 96–112)
Creatinine, Ser: 0.82 mg/dL (ref 0.40–1.20)
GFR: 79.28 mL/min (ref >60.00)
Glucose, Bld: 92 mg/dL (ref 70–99)
Potassium: 4.2 mEq/L (ref 3.5–5.1)
SODIUM: 136 meq/L (ref 135–145)
TOTAL PROTEIN: 7.2 g/dL (ref 6.0–8.3)

## 2017-08-30 LAB — CBC WITH DIFFERENTIAL/PLATELET
BASOS ABS: 0 10*3/uL (ref 0.0–0.1)
Basophils Relative: 0.4 % (ref 0.0–3.0)
EOS ABS: 0.1 10*3/uL (ref 0.0–0.7)
Eosinophils Relative: 1.6 % (ref 0.0–5.0)
HCT: 41.7 % (ref 36.0–46.0)
HEMOGLOBIN: 14.3 g/dL (ref 12.0–15.0)
LYMPHS PCT: 31.9 % (ref 12.0–46.0)
Lymphs Abs: 2.1 10*3/uL (ref 0.7–4.0)
MCHC: 34.2 g/dL (ref 30.0–36.0)
MCV: 90.6 fl (ref 78.0–100.0)
MONO ABS: 0.5 10*3/uL (ref 0.1–1.0)
Monocytes Relative: 7 % (ref 3.0–12.0)
NEUTROS ABS: 3.9 10*3/uL (ref 1.4–7.7)
Neutrophils Relative %: 59.1 % (ref 43.0–77.0)
PLATELETS: 286 10*3/uL (ref 150.0–400.0)
RBC: 4.6 Mil/uL (ref 3.87–5.11)
RDW: 13.5 % (ref 11.5–15.5)
WBC: 6.6 10*3/uL (ref 4.0–10.5)

## 2017-08-30 LAB — LIPID PANEL
CHOLESTEROL: 210 mg/dL — AB (ref 0–200)
HDL: 56.8 mg/dL (ref >39.00)
LDL Cholesterol: 136 mg/dL — ABNORMAL HIGH (ref 0–99)
NonHDL: 153.37
TRIGLYCERIDES: 89 mg/dL (ref 0.0–149.0)
Total CHOL/HDL Ratio: 4
VLDL: 17.8 mg/dL (ref 0.0–40.0)

## 2017-08-30 LAB — TSH: TSH: 3.02 u[IU]/mL (ref 0.35–4.50)

## 2017-08-30 LAB — HEMOGLOBIN A1C: HEMOGLOBIN A1C: 5.3 % (ref 4.6–6.5)

## 2017-08-30 LAB — VITAMIN D 25 HYDROXY (VIT D DEFICIENCY, FRACTURES): VITD: 22.03 ng/mL — AB (ref 30.00–100.00)

## 2017-08-30 MED ORDER — FLUTICASONE PROPIONATE 50 MCG/ACT NA SUSP: 2.0000 | Freq: Every day | NASAL | 6 refills | Status: DC

## 2017-08-30 MED FILL — FLUTICASONE PROP 50 MCG SPR: 50 | 30 days supply | Qty: 16 | Fill #0

## 2017-08-30 NOTE — Assessment & Plan Note (Signed)
Will Rx flonase- dosing and side effects discussed Also discussed use of OTC antihistamine such as claritin, allegra, zyrtec and sinus rinses- -Red flags and when to present for emergency care or RTC including fever >101.18F, chest pain, shortness of breath, new/worsening/un-resolving symptoms reviewed with patient at time of visit. Follow up and care instructions discussed and provided in AVS. See AVS for additional information provided to patient - fluticasone (FLONASE) 50 MCG/ACT nasal spray; Place 2 sprays into both nostrils daily.  Dispense: 16 g; Refill: 6

## 2017-08-30 NOTE — Assessment & Plan Note (Signed)
Stable, continue current current medication Continue follow up with counselor F/U for new or worsening symptoms - TSH; Future - Vitamin D (25 hydroxy); Future

## 2017-08-30 NOTE — Progress Notes (Addendum)
Name: Sylvia Meyer   MRN: [id number]    DOB: 1970/11/27   Date:08/30/2017       Progress Note  Subjective  Chief Complaint  Chief Complaint  Patient presents with  . Establish Care    neck and back pain, spider veins and allergies    HPI  Sylvia Meyer presents today to establish care with me. She has been following  regularly with a PCP in another practice, but needed to switch her provider due to new  insurance coverage. 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 Patient presents for annual CPE.  Diet: has not been following a regular diet  Exercise: not routinely  USPSTF grade A and B recommendations  Depression: maintained on wellbutrin with adequate control of symptoms, which are mostly seasonal- she denies thoughts of hurting herself or others Works with counselor regularly Depression screen [facility] 2/9 08/30/2017 08/15/2015 07/20/2015  Decreased Interest 0 0 0  Down, Depressed, Hopeless 0 0 0  PHQ - 2 Score 0 0 0   Hypertension: BP Readings from Last 3 Encounters:  08/30/17 110/64  01/04/17 128/86  08/15/15 106/64   Obesity: Wt Readings from Last 3 Encounters:  08/30/17 194 lb (88 kg)  01/04/17 180 lb (81.6 kg)  08/15/15 191 lb (86.6 kg)   BMI Readings from Last 3 Encounters:  08/30/17 32.28 kg/m  01/04/17 29.95 kg/m  08/15/15 31.30 kg/m    Alcohol: occasional social drink Tobacco use: no, former- quit about 10 years ago HIV: declines screening today STD testing and prevention (chl/gon/syphilis):declines screening today,no concerns for STDs Intimate partner violence: denies  Vaccinations: up to date  Breast cancer:  Mammogram up to date- she will call breast center to schedule her annual mammogram- she will notify me for any trouble scheduling appt Cervical cancer screening: She follows with GYN for routine womens health care and PAPs  Lipids:  Lab Results  Component Value Date   CHOL 195 07/20/2015   Lab Results  Component Value Date   HDL  51 07/20/2015   Lab Results  Component Value Date   LDLCALC 129 07/20/2015   Lab Results  Component Value Date   TRIG 76 07/20/2015   Lab Results  Component Value Date   CHOLHDL 3.8 07/20/2015   No results found for: LDLDIRECT  Glucose:  Glucose, Bld  Date Value Ref Range Status  07/20/2015 90 65 - 99 mg/dL Final    Skin cancer: No concerns, follows with dermatology regularly Colorectal cancer: No personal or family history of colon cancer, no bowel changes, no rectal bleeding  Aspirin: not indicated ECG:not indicated  Patient Active Problem List   Diagnosis Date Noted  . Encounter for general adult medical examination with abnormal findings 08/30/2017  . Allergic rhinitis 08/30/2017  . Adjustment disorder with mixed anxiety and depressed mood 07/20/2015    History reviewed. No pertinent surgical history.  Family History  Problem Relation Age of Onset  . Cancer Mother   . Mental illness Mother   . Hyperlipidemia Mother   . Breast cancer Mother   . Migraines Mother   . Depression Father   . Heart disease Father   . Hyperlipidemia Father   . Hypertension Father   . Stroke Father   . Diabetes Father     Social History   Socioeconomic History  . Marital status: Single    Spouse name: Not on file  . Number of children: Not on file  . Years of education: Not  on file  . Highest education level: Not on file  Social Needs  . Financial resource strain: Not on file  . Food insecurity - worry: Not on file  . Food insecurity - inability: Not on file  . Transportation needs - medical: Not on file  . Transportation needs - non-medical: Not on file  Occupational History  . Not on file  Tobacco Use  . Smoking status: Former Smoker    Types: Cigarettes    Last attempt to quit: 01/05/2007    Years since quitting: 10.6  . Smokeless tobacco: Never Used  Substance and Sexual Activity  . Alcohol use: Yes    Alcohol/week: 0.0 oz    Comment: occ  . Drug use: No  .  Sexual activity: Not Currently    Partners: Male    Comment: 1st intercourse- 86, partners- more than 10  Other Topics Concern  . Not on file  Social History Narrative  . Not on file     Current Outpatient Medications:  .  buPROPion (WELLBUTRIN XL) 150 MG 24 hr tablet, Take 150 mg by mouth daily., Disp: , Rfl:  .  fluconazole (DIFLUCAN) 150 MG tablet, Take 1 tablet (150 mg total) by mouth daily., Disp: 3 tablet, Rfl: 2 .  fluticasone (FLONASE) 50 MCG/ACT nasal spray, Place 2 sprays into both nostrils daily., Disp: 16 g, Rfl: 6 .  Multiple Vitamin (MULTIVITAMIN) tablet, Take 1 tablet by mouth daily., Disp: , Rfl:  .  vitamin B-12 (CYANOCOBALAMIN) 100 MCG tablet, Take 100 mcg by mouth daily., Disp: , Rfl:   No Known Allergies   ROS  Constitutional: Negative for fever or weight change.  Respiratory: Negative for cough and shortness of breath.   Cardiovascular: Negative for chest pain or palpitations.  Gastrointestinal: Negative for abdominal pain, no bowel changes.  Musculoskeletal: Negative for gait problem or joint swelling.  Skin: Negative for rash.  Neurological: Negative for dizziness or headache.  No other specific complaints in a complete review of systems (except as listed in HPI above).  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  Allergies-  This is a not a new problem She reports long history of seasonal allergies She has noticed her allergies have been worse over the past week or so, since staying in a moldy hotel room. She reports sinus congestion, postnasal drip, watery eyes,  sneezing. 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 and bending as a routine part of her job The pain is a tight pain in her neck, upper back and shoulders The pain became so severe at one point she began to have daily headaches She has been working with a Restaurant manager, fast food and the headaches have resolved, pain has improved but still experiences occasional pain and stiffness in her neck and upper back- especially when waking in the morning She denies weakness, numbness, falls She has tried OTC pain medications which do provide  some relief  Objective  Vitals:   08/30/17 0858  BP: 110/64  Pulse: 63  Resp: 16  Temp: 99 F (37.2 C)  TempSrc: Oral  SpO2: 96%  Weight: 194 lb (88 kg)  Height: 5\' 5"  (1.651 m)    Body mass index is 32.28 kg/m.  Physical Exam Vital signs reviewed. Constitutional: Patient appears well-developed and well-nourished. No distress.  HENT: Head: Normocephalic and atraumatic. Ears: B TMs ok, no erythema or effusion; Nose: Nose normal. Mouth/Throat: Oropharynx is clear and moist. No oropharyngeal exudate.  Eyes: Conjunctivae and EOM are normal. Pupils are equal, round, and reactive to light. No scleral icterus.  Neck: Normal range of motion. Neck supple. No cervical adenopathy. No thyromegaly present.  Cardiovascular: Normal rate, regular rhythm and normal heart sounds.  No murmur heard. No BLE edema. Multiple scattered subcutaneous tortuous veins and telangiectasias to BLE. Pulmonary/Chest: Effort normal and breath sounds normal. No respiratory distress. Abdominal: Soft. Bowel sounds are normal, no distension. There is no tenderness. no masses FEMALE GENITALIA: deferred to GYN RECTAL: deferred to GYN Musculoskeletal: Normal range of motion, No gross deformities Neurological: She is alert and  oriented to person, place, and time. No cranial nerve deficit. Coordination, balance, strength, speech and gait are normal.  Skin: Skin is warm and dry. No rash noted. No erythema.  Psychiatric: Patient has a normal mood and affect. behavior is normal. Judgment and thought content normal.  Assessment & Plan RTC in 1 year for CPE   Pain in both lower extremities We discussed referral to vein and vascular for further E&M of tortuous and varicose veins and she is agreeable We also discussed continuing compression tights daily - Ambulatory referral to Vascular Surgery

## 2017-08-30 NOTE — Patient Instructions (Addendum)
Please head downstairs for lab work.  Please make an appointment with sports medicine for neck and back pain today.  I have placed a referral to vascular for your legs . Our office will call you to schedule this appointment. You should hear from our office in 7-10 days.  I have sent a prescription for flonase to your pharmacy- 2 sprays per nostril once daily then reduce to one spray per nostril when your symptoms improve.. you should continue the flonase while you are having allergy symptoms. You may also try switching your zyrtec for claritin or allegra to see if this helps your symptoms. Sinus rinses using distilled or boiled water are also a great treatment for allergies.  Please work on your diet and exercise as we discussed. Remember half of your plate should be veggies, one-fourth carbs, one-fourth meat, and don't eat meat at every meal. Also, remember to stay away from sugary drinks. I'd like for you to start incorporating exercise into your daily schedule. Start at 10 minutes a day, working up to 30 minutes five times a week.   I will see you back in 1 year, or sooner if needed!.   Sinus Rinse What is a sinus rinse? A sinus rinse is a home treatment. It rinses your sinuses with a mixture of salt and water (saline solution). Sinuses are air-filled spaces in your skull behind the bones of your face and forehead. They open into your nasal cavity. To do a sinus rinse, you will need:  Saline solution.  Neti pot or spray bottle. This releases the saline solution into your nose and through your sinuses. You can buy neti pots and spray bottles at: ? [employer]. ? A health food store. ? Online.  When should I do a sinus rinse? A sinus rinse can help to clear your nasal cavity. It can clear:  Mucus.  Dirt.  Dust.  Pollen.  You may do a sinus rinse when you have:  A cold.  A virus.  Allergies.  A sinus infection.  A stuffy nose.  If you are considering a sinus  rinse:  Ask your child's doctor before doing a sinus rinse on your child.  Do not do a sinus rinse if you have had: ? Ear or nasal surgery. ? An ear infection. ? Blocked ears.  How do I do a sinus rinse?  Wash your hands.  Disinfect your device using the directions that came with the device.  Dry your device.  Use the solution that comes with your device or one that is sold separately in stores. Follow the mixing directions on the package.  Fill your device with the amount of saline solution as stated in the device instructions.  Stand over a sink and tilt your head sideways over the sink.  Place the spout of the device in your upper nostril (the one closer to the ceiling).  Gently pour or squeeze the saline solution into the nasal cavity. The liquid should drain to the lower nostril if you are not too congested.  Gently blow your nose. Blowing too hard may cause ear pain.  Repeat in the other nostril.  Clean and rinse your device with clean water.  Air-dry your device. Are there risks of a sinus rinse? Sinus rinse is normally very safe and helpful. However, there are a few risks, which include:  A burning feeling in the sinuses. This may happen if you do not make the saline solution as instructed. Make sure to  follow all directions when making the saline solution.  Infection from unclean water. This is rare, but possible.  Nasal irritation.  This information is not intended to replace advice given to you by your health care provider. Make sure you discuss any questions you have with your health care provider. Document Released: 12/26/2013 Document Revised: 04/27/2016 Document Reviewed: 10/16/2013 Elsevier Interactive Patient Education  2017 [employer]  Allergic Rhinitis, Adult Allergic rhinitis is an allergic reaction that affects the mucous membrane inside the nose. It causes sneezing, a runny or stuffy nose, and the feeling of mucus going down the back of the  throat (postnasal drip). Allergic rhinitis can be mild to severe. There are two types of allergic rhinitis:  Seasonal. This type is also called hay fever. It happens only during certain seasons.  Perennial. This type can happen at any time of the year.  What are the causes? This condition happens when the body's defense system (immune system) responds to certain harmless substances called allergens as though they were germs.  Seasonal allergic rhinitis is triggered by pollen, which can come from grasses, trees, and weeds. Perennial allergic rhinitis may be caused by:  House dust mites.  Pet dander.  Mold spores.  What are the signs or symptoms? Symptoms of this condition include:  Sneezing.  Runny or stuffy nose (nasal congestion).  Postnasal drip.  Itchy nose.  Tearing of the eyes.  Trouble sleeping.  Daytime sleepiness.  How is this diagnosed? This condition may be diagnosed based on:  Your medical history.  A physical exam.  Tests to check for related conditions, such as: ? Asthma. ? Pink eye. ? Ear infection. ? Upper respiratory infection.  Tests to find out which allergens trigger your symptoms. These may include skin or blood tests.  How is this treated? There is no cure for this condition, but treatment can help control symptoms. Treatment may include:  Taking medicines that block allergy symptoms, such as antihistamines. Medicine may be given as a shot, nasal spray, or pill.  Avoiding the allergen.  Desensitization. This treatment involves getting ongoing shots until your body becomes less sensitive to the allergen. This treatment may be done if other treatments do not help.  If taking medicine and avoiding the allergen does not work, new, stronger medicines may be prescribed.  Follow these instructions at home:  Find out what you are allergic to. Common allergens include smoke, dust, and pollen.  Avoid the things you are allergic to. These are  some things you can do to help avoid allergens: ? Replace carpet with wood, tile, or vinyl flooring. Carpet can trap dander and dust. ? Do not smoke. Do not allow smoking in your home. ? Change your heating and air conditioning filter at least once a month. ? During allergy season:  Keep windows closed as much as possible.  Plan outdoor activities when pollen counts are lowest. This is usually during the evening hours.  When coming indoors, change clothing and shower before sitting on furniture or bedding.  Take over-the-counter and prescription medicines only as told by your health care provider.  Keep all follow-up visits as told by your health care provider. This is important. Contact a health care provider if:  You have a fever.  You develop a persistent cough.  You make whistling sounds when you breathe (you wheeze).  Your symptoms interfere with your normal daily activities. Get help right away if:  You have shortness of breath. Summary  This condition can  be managed by taking medicines as directed and avoiding allergens.  Contact your health care provider if you develop a persistent cough or fever.  During allergy season, keep windows closed as much as possible. This information is not intended to replace advice given to you by your health care provider. Make sure you discuss any questions you have with your health care provider. Document Released: 02/23/2001 Document Revised: 07/08/2016 Document Reviewed: 07/08/2016 Elsevier Interactive Patient Education  2018 [place] Years, Female Preventive care refers to lifestyle choices and visits with your health care provider that can promote health and wellness. What does preventive care include?  A yearly physical exam. This is also called an annual well check.  Dental exams once or twice a year.  Routine eye exams. Ask your health care provider how often you should have your eyes  checked.  Personal lifestyle choices, including: ? Daily care of your teeth and gums. ? Regular physical activity. ? Eating a healthy diet. ? Avoiding tobacco and drug use. ? Limiting alcohol use. ? Practicing safe sex. ? Taking low-dose aspirin daily starting at age 51. ? Taking vitamin and mineral supplements as recommended by your health care provider. What happens during an annual well check? The services and screenings done by your health care provider during your annual well check will depend on your age, overall health, lifestyle risk factors, and family history of disease. Counseling Your health care provider may ask you questions about your:  Alcohol use.  Tobacco use.  Drug use.  Emotional well-being.  Home and relationship well-being.  Sexual activity.  Eating habits.  Work and work Statistician.  Method of birth control.  Menstrual cycle.  Pregnancy history.  Screening You may have the following tests or measurements:  Height, weight, and BMI.  Blood pressure.  Lipid and cholesterol levels. These may be checked every 5 years, or more frequently if you are over 37 years old.  Skin check.  Lung cancer screening. You may have this screening every year starting at age 24 if you have a 30-pack-year history of smoking and currently smoke or have quit within the past 15 years.  Fecal occult blood test (FOBT) of the stool. You may have this test every year starting at age 69.  Flexible sigmoidoscopy or colonoscopy. You may have a sigmoidoscopy every 5 years or a colonoscopy every 10 years starting at age 53.  Hepatitis C blood test.  Hepatitis B blood test.  Sexually transmitted disease (STD) testing.  Diabetes screening. This is done by checking your blood sugar (glucose) after you have not eaten for a while (fasting). You may have this done every 1-3 years.  Mammogram. This may be done every 1-2 years. Talk to your health care provider about when  you should start having regular mammograms. This may depend on whether you have a family history of breast cancer.  BRCA-related cancer screening. This may be done if you have a family history of breast, ovarian, tubal, or peritoneal cancers.  Pelvic exam and Pap test. This may be done every 3 years starting at age 73. Starting at age 59, this may be done every 5 years if you have a Pap test in combination with an HPV test.  Bone density scan. This is done to screen for osteoporosis. You may have this scan if you are at high risk for osteoporosis.  Discuss your test results, treatment options, and if necessary, the need for more tests with your  health care provider. Vaccines Your health care provider may recommend certain vaccines, such as:  Influenza vaccine. This is recommended every year.  Tetanus, diphtheria, and acellular pertussis (Tdap, Td) vaccine. You may need a Td booster every 10 years.  Varicella vaccine. You may need this if you have not been vaccinated.  Zoster vaccine. You may need this after age 74.  Measles, mumps, and rubella (MMR) vaccine. You may need at least one dose of MMR if you were born in 1957 or later. You may also need a second dose.  Pneumococcal 13-valent conjugate (PCV13) vaccine. You may need this if you have certain conditions and were not previously vaccinated.  Pneumococcal polysaccharide (PPSV23) vaccine. You may need one or two doses if you smoke cigarettes or if you have certain conditions.  Meningococcal vaccine. You may need this if you have certain conditions.  Hepatitis A vaccine. You may need this if you have certain conditions or if you travel or work in places where you may be exposed to hepatitis A.  Hepatitis B vaccine. You may need this if you have certain conditions or if you travel or work in places where you may be exposed to hepatitis B.  Haemophilus influenzae type b (Hib) vaccine. You may need this if you have certain  conditions.  Talk to your health care provider about which screenings and vaccines you need and how often you need them. This information is not intended to replace advice given to you by your health care provider. Make sure you discuss any questions you have with your health care provider. Document Released: 06/27/2015 Document Revised: 02/18/2016 Document Reviewed: 04/01/2015 Elsevier Interactive Patient Education  [employer].

## 2017-08-30 NOTE — Assessment & Plan Note (Signed)
-  USPSTF grade A and B recommendations reviewed with patient; age-appropriate recommendations, preventive care, screening tests, etc discussed and encouraged; healthy living encouraged; see AVS for patient education given to patient -Discussed importance of 150 minutes of physical activity weekly, eat 6 servings of fruit/vegetables daily and drink plenty of water and avoid sweet beverages.   -Reviewed Health Maintenance: up to date  Screening for cholesterol level- Lipid panel; Future  Obesity (BMI 30-39.9) - CBC with Differential/Platelet; Future - Comprehensive metabolic panel; Future - Lipid panel; Future - TSH; Future - Hemoglobin A1c; Future - Vitamin D (25 hydroxy); Future

## 2017-08-31 ENCOUNTER — Telehealth: Payer: Self-pay | Admitting: Nurse Practitioner

## 2017-08-31 NOTE — Telephone Encounter (Unsigned)
Copied from [place] [id number]. Topic: Quick Communication - Lab Results >> Aug 31, 2017  3:51 PM Lorrin Jackson, CMA wrote: Called patient to inform them of 08/30/2017 lab results. When patient returns call, triage nurse may disclose results. >> Aug 31, 2017  5:03 PM Neva Seat wrote: Pt called back regarding her recent lab results.  NT was busy. Please call pt back asap to give results.

## 2017-09-01 NOTE — Telephone Encounter (Signed)
Left message to call back for lab results- see basket

## 2017-09-06 DIAGNOSIS — F330 Major depressive disorder, recurrent, mild: Secondary | ICD-10-CM | POA: Diagnosis not present

## 2017-09-13 NOTE — Progress Notes (Signed)
[facility] [address] [place], [place] [address] Phone: [phone] Subjective:    I'm seeing this patient by the request  of:  Lance Sell, NP   CC: Neck and back pain  PPJ:[id number]  Sylvia Meyer is a 47 y.o. female coming in with complaint of cervical and thoracic spine pain. She has had pain for 3 years that is intermittent. She had headaches last year in which she sought chiropractic care for which alleviated her pain. She does have pain in between the scapula when she gets the headaches. The last episode of pain was one month ago. More pain on the right side. She does have nerve pain running down the right arm. Patient has been to physical therapy in the past and she does try to do some stretches at home.   Onset- chronic Location- cervical and thoracic spine pain Duration- intermittent Character- dull ache Aggravating factors-  Reliving factors- stretching Therapies tried- physical therapy Severity- 0/10 today     Past Medical History:  Diagnosis Date  . Allergy   . Anemia   . Anxiety   . Depression    No past surgical history on file. Social History   Socioeconomic History  . Marital status: Single    Spouse name: Not on file  . Number of children: Not on file  . Years of education: Not on file  . Highest education level: Not on file  Occupational History  . Not on file  Social Needs  . Financial resource strain: Not on file  . Food insecurity:    Worry: Not on file    Inability: Not on file  . Transportation needs:    Medical: Not on file    Non-medical: Not on file  Tobacco Use  . Smoking status: Former Smoker    Types: Cigarettes    Last attempt to quit: 01/05/2007    Years since quitting: 10.6  . Smokeless tobacco: Never Used  Substance and Sexual Activity  . Alcohol use: Yes    Alcohol/week: 0.0 oz    Comment: occ  . Drug use: No  . Sexual activity: Not Currently    Partners: Male    Comment:  1st intercourse- 89, partners- more than 10  Lifestyle  . Physical activity:    Days per week: Not on file    Minutes per session: Not on file  . Stress: Not on file  Relationships  . Social connections:    Talks on phone: Not on file    Gets together: Not on file    Attends religious service: Not on file    Active member of club or organization: Not on file    Attends meetings of clubs or organizations: Not on file    Relationship status: Not on file  Other Topics Concern  . Not on file  Social History Narrative  . Not on file   No Known Allergies Family History  Problem Relation Age of Onset  . Cancer Mother   . Mental illness Mother   . Hyperlipidemia Mother   . Breast cancer Mother   . Migraines Mother   . Depression Father   . Heart disease Father   . Hyperlipidemia Father   . Hypertension Father   . Stroke Father   . Diabetes Father      Past medical history, social, surgical and family history all reviewed in electronic medical record.  No pertanent information unless stated regarding to the chief  complaint.   Review of Systems:Review of systems updated and as accurate as of 09/14/17  No headache, visual changes, nausea, vomiting, diarrhea, constipation, dizziness, abdominal pain, skin rash, fevers, chills, night sweats, weight loss, swollen lymph nodes, body aches, joint swelling, muscle aches, chest pain, shortness of breath, mood changes.   Objective  Blood pressure 118/70, pulse 83, height 5\' 5"  (1.651 m), weight 193 lb (87.5 kg), SpO2 98 %. Systems examined below as of 09/14/17   General: No apparent distress alert and oriented x3 mood and affect normal, dressed appropriately.  HEENT: Pupils equal, extraocular movements intact  Respiratory: Patient's speak in full sentences and does not appear short of breath  Cardiovascular: No lower extremity edema, non tender, no erythema  Skin: Warm dry intact with no signs of infection or rash on extremities or on  axial skeleton.  Abdomen: Soft nontender  Neuro: Cranial nerves II through XII are intact, neurovascularly intact in all extremities with 2+ DTRs and 2+ pulses.  Lymph: No lymphadenopathy of posterior or anterior cervical chain or axillae bilaterally.  Gait normal with good balance and coordination.  MSK:  Non tender with full range of motion and good stability and symmetric strength and tone of shoulders, elbows, wrist, hip, knee and ankles bilaterally.  Neck exam shows the patient is in full range of motion.  Patient does have some mild tightness of the trapezius bilaterally.  Negative Spurling's test.  Does have some mild scapular dyskinesis on the right side.  Osteopathic findings C2 flexed rotated and side bent right C4 flexed rotated and side bent left T3 extended rotated and side bent right inhaled third rib T7 extended rotated and side bent left L4 flexed rotated and side bent right Sacrum right on right    Impression and Recommendations:     This case required medical decision making of moderate complexity.      Note: This dictation was prepared with Dragon dictation along with smaller phrase technology. Any transcriptional errors that result from this process are unintentional.

## 2017-09-14 ENCOUNTER — Ambulatory Visit (INDEPENDENT_AMBULATORY_CARE_PROVIDER_SITE_OTHER): Payer: 59 | Admitting: Family Medicine

## 2017-09-14 ENCOUNTER — Encounter: Payer: Self-pay | Admitting: Nurse Practitioner

## 2017-09-14 ENCOUNTER — Encounter: Payer: Self-pay | Admitting: Family Medicine

## 2017-09-14 DIAGNOSIS — M999 Biomechanical lesion, unspecified: Secondary | ICD-10-CM | POA: Insufficient documentation

## 2017-09-14 DIAGNOSIS — R293 Abnormal posture: Secondary | ICD-10-CM | POA: Diagnosis not present

## 2017-09-14 DIAGNOSIS — M940 Chondrocostal junction syndrome [Tietze]: Secondary | ICD-10-CM | POA: Insufficient documentation

## 2017-09-14 NOTE — Assessment & Plan Note (Signed)
Poor posture with scapular dyskinesis.  Exercises given.  Home exercises that I think will be beneficial.  Discussed ergonomic changes.  Discussed over-the-counter medications.  Responded well to osteopathic manipulation.  Follow-up again in 4-6 weeks

## 2017-09-14 NOTE — Assessment & Plan Note (Signed)
Decision today to treat with OMT was based on Physical Exam  After verbal consent patient was treated with HVLA, ME, FPR techniques in cervical, thoracic, rib lumbar and sacral areas  Patient tolerated the procedure well with improvement in symptoms  Patient given exercises, stretches and lifestyle modifications  See medications in patient instructions if given  Patient will follow up in 4-6 weeks 

## 2017-09-14 NOTE — Patient Instructions (Signed)
Good to see you  Ice when hurting A lot of this is posture On wall with heels, butt shoulder and head touching for a goal of 5 minutes daily  2 tennis ball in a tube sock and lay on them where head meets neck  Exercises 3 times a week.   Over the counter try  Vitamin D 2000 IU dialy  Turmeric 500mg  daily  CoQ10 200mg  daily  Iron 65mg  with 500mg  of vitamin C with period.  Memory foam non contour firm pillow  Try to have monitor at eye level  See me again in 4 weeks

## 2017-09-16 MED ORDER — BUPROPION HCL ER (XL) 150 MG PO TB24: 150.0000 mg | ORAL_TABLET | Freq: Every day | ORAL | 0 refills | Status: DC

## 2017-09-22 DIAGNOSIS — F330 Major depressive disorder, recurrent, mild: Secondary | ICD-10-CM | POA: Diagnosis not present

## 2017-09-30 DIAGNOSIS — F330 Major depressive disorder, recurrent, mild: Secondary | ICD-10-CM | POA: Diagnosis not present

## 2017-10-13 MED FILL — buPROPion HCL ER (XL) 150 M: 150 | 90 days supply | Qty: 90 | Fill #0

## 2017-10-18 ENCOUNTER — Encounter: Payer: Self-pay | Admitting: Nurse Practitioner

## 2017-10-19 ENCOUNTER — Ambulatory Visit (INDEPENDENT_AMBULATORY_CARE_PROVIDER_SITE_OTHER): Payer: 59 | Admitting: Vascular Surgery

## 2017-10-19 ENCOUNTER — Ambulatory Visit (HOSPITAL_COMMUNITY)
Admission: RE | Admit: 2017-10-19 | Discharge: 2017-10-19 | Disposition: A | Discharge status: Home / Self Care | Payer: 59 | Source: Ambulatory Visit | Attending: Vascular Surgery | Admitting: Vascular Surgery

## 2017-10-19 ENCOUNTER — Other Ambulatory Visit: Payer: Self-pay

## 2017-10-19 ENCOUNTER — Encounter: Payer: Self-pay | Admitting: Vascular Surgery

## 2017-10-19 VITALS — BP 104/73 | HR 69 | Resp 18 | Ht 65.0 in | Wt 192.1 lb

## 2017-10-19 DIAGNOSIS — I83893 Varicose veins of bilateral lower extremities with other complications: Secondary | ICD-10-CM | POA: Insufficient documentation

## 2017-10-19 DIAGNOSIS — I872 Venous insufficiency (chronic) (peripheral): Secondary | ICD-10-CM | POA: Diagnosis not present

## 2017-10-19 NOTE — Progress Notes (Signed)
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  Patient notes, onset of swelling years ago, associated with no obvious trigger.  The patient's symptoms include: burning sensation overlying varicoses, increasing distribution of spider veins and varicosities and increased swelling with extended standing .  The patient has had no history of DVT, known history of pregnancy, known history of varicose vein, no history of venous stasis ulcers, no history of  Lymphedema and no history of skin changes in lower legs.  There is possible family history of venous disorders.  The patient has used OTC compression stockings in the past.  Past Medical History:  Diagnosis Date  . Allergy   . Anemia   . Anxiety   . Depression     History reviewed. No pertinent surgical history.  Social History   Socioeconomic History  . Marital status: Single    Spouse name: Not on file  . Number of children: Not on file  . Years of education: Not on file  . Highest education level: Not on file  Occupational History  . Not on file  Social Needs  . Financial resource strain: Not on file  . Food insecurity:    Worry: Not on file    Inability: Not on file  . Transportation needs:    Medical: Not on file    Non-medical: Not on file  Tobacco Use  . Smoking status: Former Smoker    Types: Cigarettes    Last attempt to quit: 01/05/2007    Years since quitting: 10.7  . Smokeless tobacco: Never Used  Substance and Sexual Activity  . Alcohol use: Yes    Alcohol/week: 0.0 oz    Comment: occ  . Drug use: No  . Sexual activity: Not Currently    Partners: Male    Comment: 1st intercourse- 20, partners- more than 10  Lifestyle  . Physical activity:    Days per week: Not on file    Minutes per  session: Not on file  . Stress: Not on file  Relationships  . Social connections:    Talks on phone: Not on file    Gets together: Not on file    Attends religious service: Not on file    Active member of club or organization: Not on file    Attends meetings of clubs or organizations: Not on file    Relationship status: Not on file  . Intimate partner violence:    Fear of current or ex partner: Not on file    Emotionally abused: Not on file    Physically abused: Not on file    Forced sexual activity: Not on file  Other Topics Concern  . Not on file  Social History Narrative  . Not on file    Family History  Problem Relation Age of Onset  . Cancer Mother   . Mental illness Mother   . Hyperlipidemia Mother   . Breast cancer Mother   . Migraines Mother   . Depression Father   . Heart disease Father   . Hyperlipidemia Father   . Hypertension Father   . Stroke Father   . Diabetes Father     Current Outpatient Medications  Medication Sig Dispense Refill  . buPROPion (WELLBUTRIN XL) 150 MG 24  hr tablet Take 1 tablet (150 mg total) by mouth daily. 90 tablet 0  . Cholecalciferol (VITAMIN D3) 2000 units TABS Take by mouth daily.    . Multiple Vitamin (MULTIVITAMIN) tablet Take 1 tablet by mouth daily.    . vitamin B-12 (CYANOCOBALAMIN) 100 MCG tablet Take 100 mcg by mouth daily.    . fluconazole (DIFLUCAN) 150 MG tablet Take 1 tablet (150 mg total) by mouth daily. (Patient not taking: Reported on 09/14/2017) 3 tablet 2  . fluticasone (FLONASE) 50 MCG/ACT nasal spray Place 2 sprays into both nostrils daily. (Patient not taking: Reported on 10/19/2017) 16 g 6   No current facility-administered medications for this visit.     No Known Allergies  REVIEW OF SYSTEMS (negative unless checked):   Cardiac:  []  Chest pain or chest pressure? []  Shortness of breath upon activity? []  Shortness of breath when lying flat? []  Irregular heart rhythm?  Vascular:  []  Pain in calf, thigh, or  hip brought on by walking? []  Pain in feet at night that wakes you up from your sleep? []  Blood clot in your veins? [x]  Leg swelling?  Pulmonary:  []  Oxygen at home? []  Productive cough? []  Wheezing?  Neurologic:  []  Sudden weakness in arms or legs? []  Sudden numbness in arms or legs? []  Sudden onset of difficult speaking or slurred speech? []  Temporary loss of vision in one eye? []  Problems with dizziness?  Gastrointestinal:  []  Blood in stool? []  Vomited blood?  Genitourinary:  []  Burning when urinating? []  Blood in urine?  Psychiatric:  []  Major depression  Hematologic:  []  Bleeding problems? []  Problems with blood clotting?  Dermatologic:  []  Rashes or ulcers?  Constitutional:  []  Fever or chills?  Ear/Nose/Throat:  []  Change in hearing? []  Nose bleeds? []  Sore throat?  Musculoskeletal:  []  Back pain? []  Joint pain? []  Muscle pain?   Physical Examination     Vitals:   10/19/17 1401  BP: 104/73  Pulse: 69  Resp: 18  SpO2: 100%  Weight: 192 lb 1.6 oz (87.1 kg)  Height: 5\' 5"  (1.651 m)   Body mass index is 31.97 kg/m.  General Alert, O x 3, WD, NAD  Head [place]/AT,    Ear/Nose/ Throat Hearing grossly intact, nares without erythema or drainage, oropharynx without Erythema or Exudate, Mallampati score: 3,   Eyes PERRLA, EOMI,    Neck Supple, mid-line trachea,    Pulmonary Sym exp, good B air movt, CTA B  Cardiac RRR, Nl S1, S2, no Murmurs, No rubs, No S3,S4  Vascular Vessel Right Left  Radial Palpable Palpable  Brachial Palpable Palpable  Carotid Palpable, No Bruit Palpable, No Bruit  Aorta Not palpable N/A  Femoral Palpable Palpable  Popliteal Not palpable Not palpable  PT Palpable Palpable  DP Palpable Palpable    Gastro- intestinal soft, non-distended, non-tender to palpation, No guarding or rebound, no HSM, no masses, no CVAT B, No palpable prominent aortic pulse,    Musculo- skeletal M/S 5/5 throughout  , Extremities without  ischemic changes  , No edema present, Varicosities present: B small to moderate, spider veins throughout, No Lipodermatosclerosis present, no palpable cord near R calf  Neurologic Cranial nerves 2-12 intact , Pain and light touch intact in extremities , Motor exam as listed above  Psychiatric Judgement intact, Mood & affect appropriate for pt's clinical situation  Dermatologic See M/S exam for extremity exam, No rashes otherwise noted  Lymphatic  Palpable lymph nodes: None    Non-invasive Vascular  Imaging   BLE Venous Insufficiency Duplex (10/19/2017):   RLE:   no DVT and SVT,   + GSV reflux: 4.4-6.1 mm, no visualized at distal thigh  no SSV reflux,  + deep venous reflux: CFV, PV  LLE:  no DVT and SVT,   + GSV reflux: 3.5-5.3 mm  no SSV reflux,  + deep venous reflux: CFV   Medical Decision Making   Sylvia Meyer is a 47 y.o. female who presents with: BLE chronic venous insufficiency (C2), varicose veins with complications   Based on the patient's history and examination, I recommend: compressive therapy.  I discussed with the patient the use of her 20-30 mm thigh high compression stockings and need for 3 month trial of such.  The patient will follow up in 3 months with my partners in the [facility] for evaluation for: re-evaluation for possible EVLA.    Today's insufficiency duplex doesn't full evaluate this patient's GSV B, so repeat imaging may be needed.  Thank you for allowing [place] to participate in this patient's care.   Adele Barthel, MD, FACS [facility] Office: [phone] Pager: [phone]  10/19/2017, 2:21 PM

## 2017-10-25 NOTE — Progress Notes (Signed)
[name] [address] [place], [place] [address] Phone: [phone] Subjective:    I'm seeing this patient by the request  of:    CC: Upper back pain and neck pain follow-up  VHQ:[id number]  Sylvia Meyer is a 47 y.o. female coming in with complaint of upper back pain.  Was found to have poor posture, scapular dyskinesis and does have a large breast tissue that is likely contributing to patient's aches and pains.  Patient has been doing home exercises and responded very well to osteopathic manipulation.  Patient states that seems to be making some improvement.  Patient states that it is not quite as severe as what it is been previously.  He did do some lifting the other day and started having some increasing discomfort though in the area on the right scapular region again.    Past Medical History:  Diagnosis Date  . Allergy   . Anemia   . Anxiety   . Depression    No past surgical history on file. Social History   Socioeconomic History  . Marital status: Single    Spouse name: Not on file  . Number of children: Not on file  . Years of education: Not on file  . Highest education level: Not on file  Occupational History  . Not on file  Social Needs  . Financial resource strain: Not on file  . Food insecurity:    Worry: Not on file    Inability: Not on file  . Transportation needs:    Medical: Not on file    Non-medical: Not on file  Tobacco Use  . Smoking status: Former Smoker    Types: Cigarettes    Last attempt to quit: 01/05/2007    Years since quitting: 10.8  . Smokeless tobacco: Never Used  Substance and Sexual Activity  . Alcohol use: Yes    Alcohol/week: 0.0 oz    Comment: occ  . Drug use: No  . Sexual activity: Not Currently    Partners: Male    Comment: 1st intercourse- 64, partners- more than 10  Lifestyle  . Physical activity:    Days per week: Not on file    Minutes per session: Not on file  . Stress: Not on file    Relationships  . Social connections:    Talks on phone: Not on file    Gets together: Not on file    Attends religious service: Not on file    Active member of club or organization: Not on file    Attends meetings of clubs or organizations: Not on file    Relationship status: Not on file  Other Topics Concern  . Not on file  Social History Narrative  . Not on file   No Known Allergies Family History  Problem Relation Age of Onset  . Cancer Mother   . Mental illness Mother   . Hyperlipidemia Mother   . Breast cancer Mother   . Migraines Mother   . Depression Father   . Heart disease Father   . Hyperlipidemia Father   . Hypertension Father   . Stroke Father   . Diabetes Father      Past medical history, social, surgical and family history all reviewed in electronic medical record.  No pertanent information unless stated regarding to the chief complaint.   Review of Systems:Review of systems updated and as accurate as of 10/26/17  No headache, visual changes, nausea, vomiting, diarrhea, constipation,  dizziness, abdominal pain, skin rash, fevers, chills, night sweats, weight loss, swollen lymph nodes, body aches, joint swelling,chest pain, shortness of breath, mood changes.  Positive muscle aches  Objective  Blood pressure 118/70, pulse 63, height 5\' 5"  (1.651 m), weight 193 lb (87.5 kg), SpO2 97 %. Systems examined below as of 10/26/17   General: No apparent distress alert and oriented x3 mood and affect normal, dressed appropriately.  HEENT: Pupils equal, extraocular movements intact  Respiratory: Patient's speak in full sentences and does not appear short of breath  Cardiovascular: No lower extremity edema, non tender, no erythema  Skin: Warm dry intact with no signs of infection or rash on extremities or on axial skeleton.  Abdomen: Soft nontender  Neuro: Cranial nerves II through XII are intact, neurovascularly intact in all extremities with 2+ DTRs and 2+ pulses.   Lymph: No lymphadenopathy of posterior or anterior cervical chain or axillae bilaterally.  Gait normal with good balance and coordination.  MSK:  Non tender with full range of motion and good stability and symmetric strength and tone of shoulders, elbows, wrist, hip, knee and ankles bilaterally.  Back Exam:  Inspection: Unremarkable  Motion: Flexion 45 deg, Extension 25 deg, Side Bending to 45 deg bilaterally,  Rotation to 45 deg bilaterally  SLR laying: Negative  XSLR laying: Negative  Palpable tenderness: Tightness in the right side of the periscapular region of the musculature mostly around T5-T6. FABER: negative. Sensory change: Gross sensation intact to all lumbar and sacral dermatomes.  Reflexes: 2+ at both patellar tendons, 2+ at achilles tendons, Babinski's downgoing.  Strength at foot  Plantar-flexion: 5/5 Dorsi-flexion: 5/5 Eversion: 5/5 Inversion: 5/5  Leg strength  Quad: 5/5 Hamstring: 5/5 Hip flexor: 5/5 Hip abductors: 5/5  Gait unremarkable.  Osteopathic findings  C2 flexed rotated and side bent right C4 flexed rotated and side bent left C6 flexed rotated and side bent left T5 extended rotated and side bent right inhaled rib T9 extended rotated and side bent left L2 flexed rotated and side bent right Sacrum right on right     Impression and Recommendations:     This case required medical decision making of moderate complexity.      Note: This dictation was prepared with Dragon dictation along with smaller phrase technology. Any transcriptional errors that result from this process are unintentional.

## 2017-10-26 ENCOUNTER — Encounter: Payer: Self-pay | Admitting: Family Medicine

## 2017-10-26 ENCOUNTER — Ambulatory Visit (INDEPENDENT_AMBULATORY_CARE_PROVIDER_SITE_OTHER): Payer: 59 | Admitting: Family Medicine

## 2017-10-26 VITALS — BP 118/70 | HR 63 | Ht 65.0 in | Wt 193.0 lb

## 2017-10-26 DIAGNOSIS — M940 Chondrocostal junction syndrome [Tietze]: Secondary | ICD-10-CM

## 2017-10-26 DIAGNOSIS — R293 Abnormal posture: Secondary | ICD-10-CM | POA: Diagnosis not present

## 2017-10-26 DIAGNOSIS — M999 Biomechanical lesion, unspecified: Secondary | ICD-10-CM | POA: Diagnosis not present

## 2017-10-26 DIAGNOSIS — N62 Hypertrophy of breast: Secondary | ICD-10-CM | POA: Insufficient documentation

## 2017-10-26 NOTE — Assessment & Plan Note (Signed)
Decision today to treat with OMT was based on Physical Exam  After verbal consent patient was treated with HVLA, ME, FPR techniques in cervical, thoracic, rib, lumbar and sacral areas  Patient tolerated the procedure well with improvement in symptoms  Patient given exercises, stretches and lifestyle modifications  See medications in patient instructions if given  Patient will follow up in 8 weeks 

## 2017-10-26 NOTE — Patient Instructions (Signed)
Good to see you  You are doing amazing!!! Keep it up  We will consider PT at some point.  Lets keep doing what you are doing.  Tennis ball at area and take a weight in your right arm and lay on the tennis ball and just breath  See me again in 6-8 weeks

## 2017-10-26 NOTE — Assessment & Plan Note (Signed)
Poor posture as well as patient's large breast tissue likely contributing to the muscle imbalances and contributing to some of the aches and pains.  Does affect daily activities.  Patient has done formal physical therapy in the past and we are attempting osteopathic manipulation.  Patient could be a candidate for a breast reduction in the long run but will continue with conservative therapy at this time.  Discussed icing regimen and home exercise.  Follow-up again in 8 weeks

## 2017-11-08 DIAGNOSIS — D485 Neoplasm of uncertain behavior of skin: Secondary | ICD-10-CM | POA: Diagnosis not present

## 2017-11-08 DIAGNOSIS — D2362 Other benign neoplasm of skin of left upper limb, including shoulder: Secondary | ICD-10-CM | POA: Diagnosis not present

## 2017-11-14 MED FILL — FLUCONAZOLE 150 MG TABS: 150 | 3 days supply | Qty: 3 | Fill #2

## 2017-12-22 ENCOUNTER — Ambulatory Visit: Payer: 59 | Admitting: Family Medicine

## 2017-12-27 ENCOUNTER — Other Ambulatory Visit: Payer: Self-pay | Admitting: Obstetrics & Gynecology

## 2017-12-27 DIAGNOSIS — Z1231 Encounter for screening mammogram for malignant neoplasm of breast: Secondary | ICD-10-CM

## 2018-01-06 DIAGNOSIS — F330 Major depressive disorder, recurrent, mild: Secondary | ICD-10-CM | POA: Diagnosis not present

## 2018-01-26 ENCOUNTER — Ambulatory Visit
Admission: RE | Admit: 2018-01-26 | Discharge: 2018-01-26 | Disposition: A | Discharge status: Home / Self Care | Payer: 59 | Source: Ambulatory Visit | Attending: Obstetrics & Gynecology | Admitting: Obstetrics & Gynecology

## 2018-01-26 ENCOUNTER — Ambulatory Visit: Payer: 59 | Admitting: Vascular Surgery

## 2018-01-26 DIAGNOSIS — Z1231 Encounter for screening mammogram for malignant neoplasm of breast: Secondary | ICD-10-CM

## 2018-01-26 DIAGNOSIS — H524 Presbyopia: Secondary | ICD-10-CM | POA: Diagnosis not present

## 2018-02-08 ENCOUNTER — Ambulatory Visit: Payer: 59 | Admitting: Vascular Surgery

## 2018-02-09 ENCOUNTER — Encounter: Payer: Self-pay | Admitting: Vascular Surgery

## 2018-03-04 ENCOUNTER — Other Ambulatory Visit: Payer: Self-pay | Admitting: Nurse Practitioner

## 2018-03-06 MED ORDER — BUPROPION HCL ER (XL) 150 MG PO TB24: 150.0000 mg | ORAL_TABLET | Freq: Every day | ORAL | 1 refills | Status: DC

## 2018-03-06 MED FILL — buPROPion HCL ER (XL) 150 M: 150 | 90 days supply | Qty: 90 | Fill #0

## 2018-03-10 DIAGNOSIS — F330 Major depressive disorder, recurrent, mild: Secondary | ICD-10-CM | POA: Diagnosis not present

## 2018-03-22 DIAGNOSIS — F330 Major depressive disorder, recurrent, mild: Secondary | ICD-10-CM | POA: Diagnosis not present

## 2018-04-25 DIAGNOSIS — F330 Major depressive disorder, recurrent, mild: Secondary | ICD-10-CM | POA: Diagnosis not present

## 2018-05-18 DIAGNOSIS — F330 Major depressive disorder, recurrent, mild: Secondary | ICD-10-CM | POA: Diagnosis not present

## 2018-06-19 MED FILL — buPROPion HCL ER (XL) 150 M: 150 | 90 days supply | Qty: 90 | Fill #1

## 2018-09-08 ENCOUNTER — Other Ambulatory Visit: Payer: Self-pay | Admitting: Nurse Practitioner

## 2018-09-08 ENCOUNTER — Encounter: Payer: Self-pay | Admitting: Nurse Practitioner

## 2018-09-08 MED ORDER — BUPROPION HCL ER (XL) 150 MG PO TB24: 150.0000 mg | ORAL_TABLET | Freq: Every day | ORAL | 0 refills | Status: DC

## 2018-09-08 MED FILL — buPROPion HCL ER (XL) 150 M: 150 | 90 days supply | Qty: 90 | Fill #0

## 2018-09-14 ENCOUNTER — Encounter: Payer: Self-pay | Admitting: Family Medicine

## 2018-09-18 ENCOUNTER — Ambulatory Visit (INDEPENDENT_AMBULATORY_CARE_PROVIDER_SITE_OTHER): Payer: No Typology Code available for payment source | Admitting: Family Medicine

## 2018-09-18 ENCOUNTER — Other Ambulatory Visit: Payer: Self-pay

## 2018-09-18 ENCOUNTER — Encounter: Payer: Self-pay | Admitting: Family Medicine

## 2018-09-18 ENCOUNTER — Encounter: Payer: 59 | Admitting: Nurse Practitioner

## 2018-09-18 DIAGNOSIS — F419 Anxiety disorder, unspecified: Secondary | ICD-10-CM

## 2018-09-18 DIAGNOSIS — F329 Major depressive disorder, single episode, unspecified: Secondary | ICD-10-CM | POA: Diagnosis not present

## 2018-09-18 NOTE — Progress Notes (Signed)
Virtual Visit via Video Note Pt's video was not working.  Continued visit via audio only.  I connected with Sylvia Meyer. Cuccaro on 09/18/18 at 11:30 AM EDT by a video enabled telemedicine application and verified that I am speaking with the correct person using two identifiers.  Location patient: home Location provider:home office Persons participating in the virtual visit: patient, provider  I discussed the limitations of evaluation and management by telemedicine and the availability of in person appointments. The patient expressed understanding and agreed to proceed.   HPI: F/u on chronic conditions and TOC, previously seen by Caesar Chestnut, NP.  Pt was scheduled for a CPE, but understands it will need to be rescheduled given issues presented by COVID-19.  Pt states 2019 was a "rough" year for her.  Pt's mom was dx'd with ALS in July 2019.  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  Pt had chest pain on 3 occasions, but did not have it checked.  Thinks CP was related to stress and anxiety as she has not had any episodes since her mother passed.  Pt denies HTN, bp tends to be on the low side.  Denies elevated cholesterol, HAs, CP, SOB, n/v.     Exercising by walking, gardening, walking her dogs on the weekend.  Eating salads, cooking some at home, eating more vegetables, snacking more (hummus and cheese).    H/o anxiety and depression.  Wellbutrin just refilled.  Mood is ok.  States has been having a hard time as her daughter recently shared she is transgender.  Pt states she has always been supportive of her daughter, especially when she came out as a lesbian, but notes this feels like another death in the family.  Pt has gone to counseling in the past.  Pt's counselor is at the [facility].  Pt has yet to contact her about her recent feelings.  Sleep is good, may take melatonin.  May wake up in the middle of the night.    Allergies: NKDA  ROS: See pertinent  positives and negatives per HPI.  Past Medical History:  Diagnosis Date  . Allergy   . Anemia   . Anxiety   . Depression     No past surgical history on file.  Family History  Problem Relation Age of Onset  . Cancer Mother   . Mental illness Mother   . Hyperlipidemia Mother   . Breast cancer Mother   . Migraines Mother   . Depression Father   . Heart disease Father   . Hyperlipidemia Father   . Hypertension Father   . Stroke Father   . Diabetes Father     SOCIAL HX: denies tobacco and drug use.  States very rarely drinks EtOH. 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   Current Outpatient Medications:  .  buPROPion (WELLBUTRIN XL) 150 MG 24 hr tablet, Take 1 tablet (150 mg total) by mouth daily., Disp: 90 tablet, Rfl: 0 .  Cholecalciferol (VITAMIN D3) 2000 units TABS, Take by mouth daily., Disp: , Rfl:  .  fluconazole (DIFLUCAN) 150 MG tablet, Take 1 tablet (150 mg total) by mouth daily., Disp: 3 tablet, Rfl: 2 .  fluticasone (FLONASE) 50 MCG/ACT nasal spray, Place 2 sprays into both nostrils daily., Disp: 16 g, Rfl: 6 .  Multiple Vitamin (MULTIVITAMIN) tablet, Take 1 tablet by mouth daily., Disp: , Rfl:  .  vitamin B-12 (CYANOCOBALAMIN) 100 MCG tablet,  Take 100 mcg by mouth daily., Disp: , Rfl:   EXAM:  VITALS per patient if applicable:  RR seems normal, between 12-20 bpm  GENERAL: Sounds alert, oriented, and in no acute distress.  Tearful at one point in the conversation  LUNGS: no signs of respiratory distress, SOB, gasping or wheezing  PSYCH/NEURO: pleasant and cooperative, speech and thought processing grossly intact, tearful at one point  ASSESSMENT AND PLAN:  Discussed the following assessment and plan:  Anxiety and depression  -stable -continue Wellbutrin XL 150 mg daily. -pt encouraged to f/u with her counselor. -will continue to monitor  Will schedule CPE in the next few months depending on the COVID 19 situation.   I discussed the  assessment and treatment plan with the patient. The patient was provided an opportunity to ask questions and all were answered. The patient agreed with the plan and demonstrated an understanding of the instructions.   The patient was advised to call back or seek an in-person evaluation if the symptoms worsen or if the condition fails to improve as anticipated.  I provided 25 minutes of non-face-to-face time during this encounter.   Billie Ruddy, MD

## 2018-11-10 IMAGING — MG DIGITAL SCREENING BILATERAL MAMMOGRAM WITH TOMO AND CAD
8 series · 8 of 24 positions shown · non-contrast
Comparison: Previous exam(s).

CLINICAL DATA: Screening.

EXAM:
DIGITAL SCREENING BILATERAL MAMMOGRAM WITH TOMO AND CAD

[R MLO synth-2D]
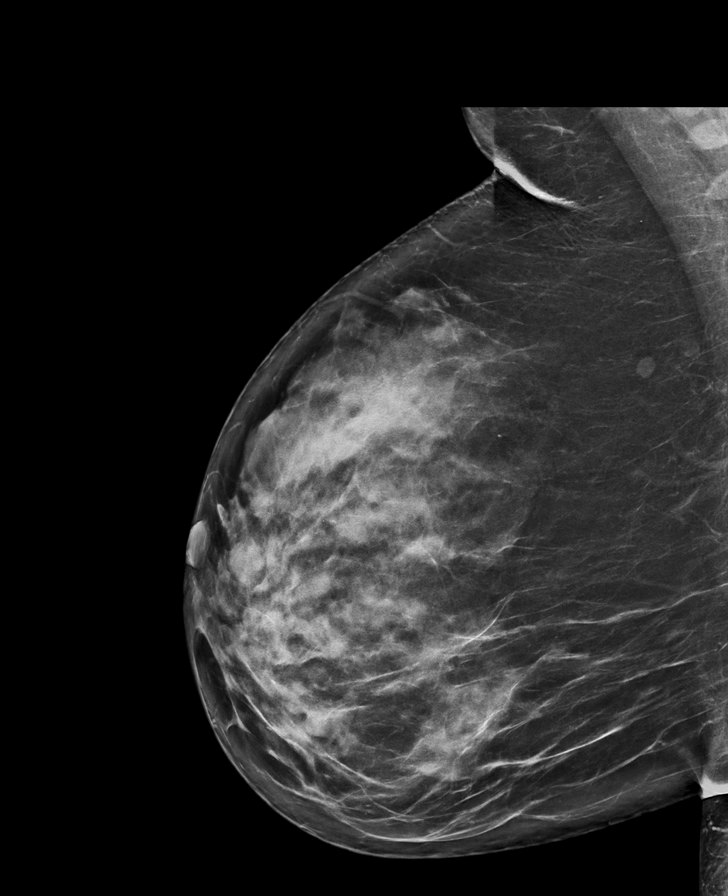

[L MLO synth-2D]
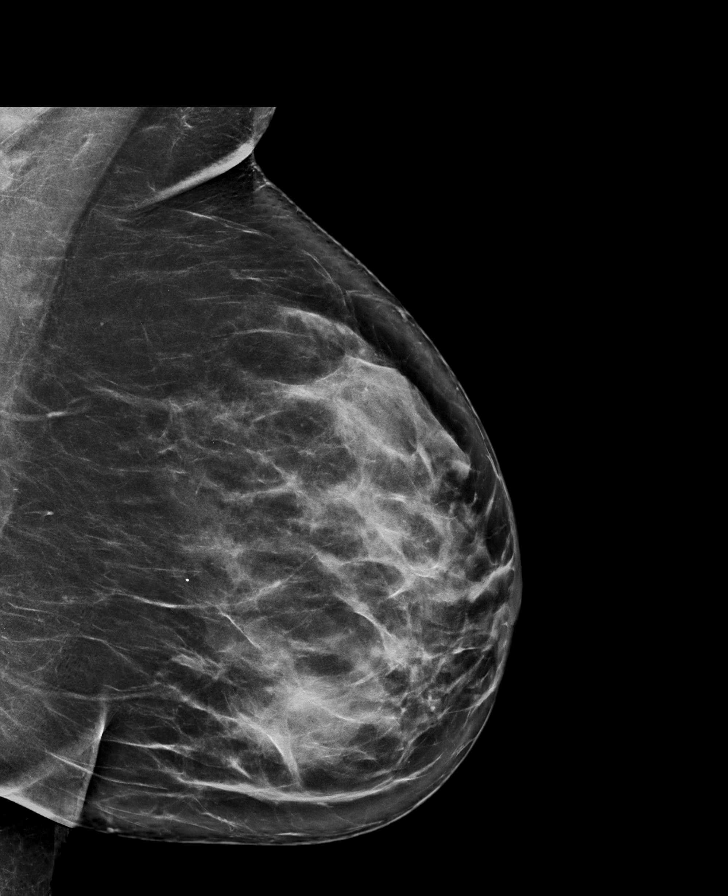

[L CC synth-2D]
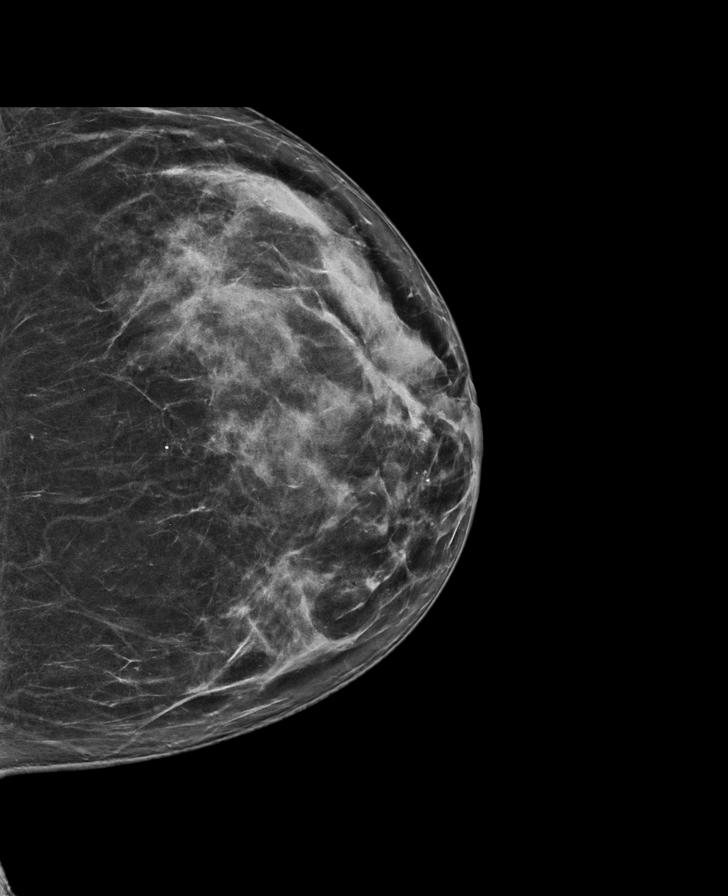

[R CC synth-2D]
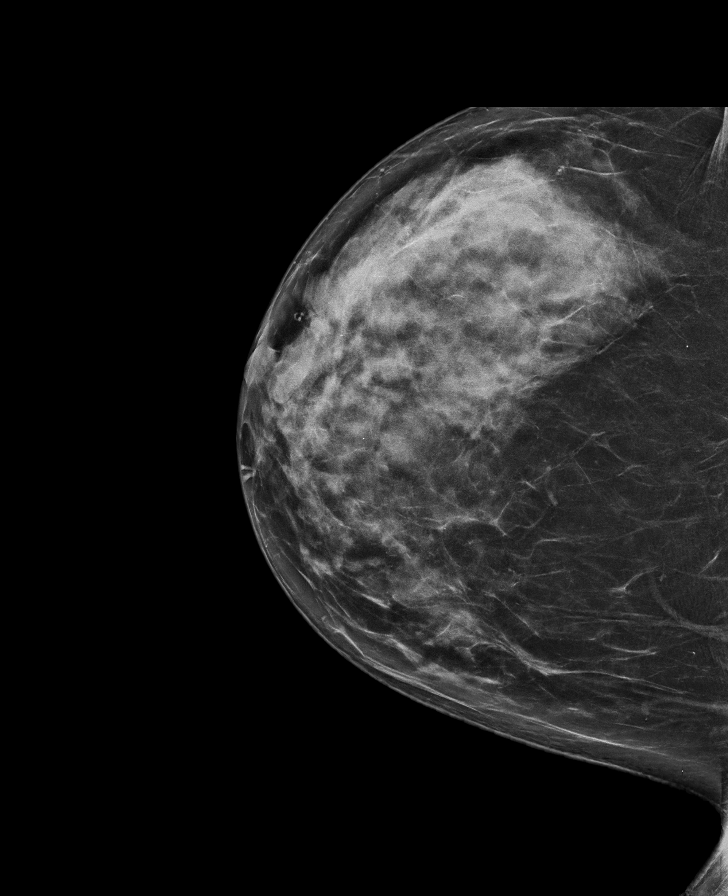

[R CC tomo · tomo slice 43/84.0]
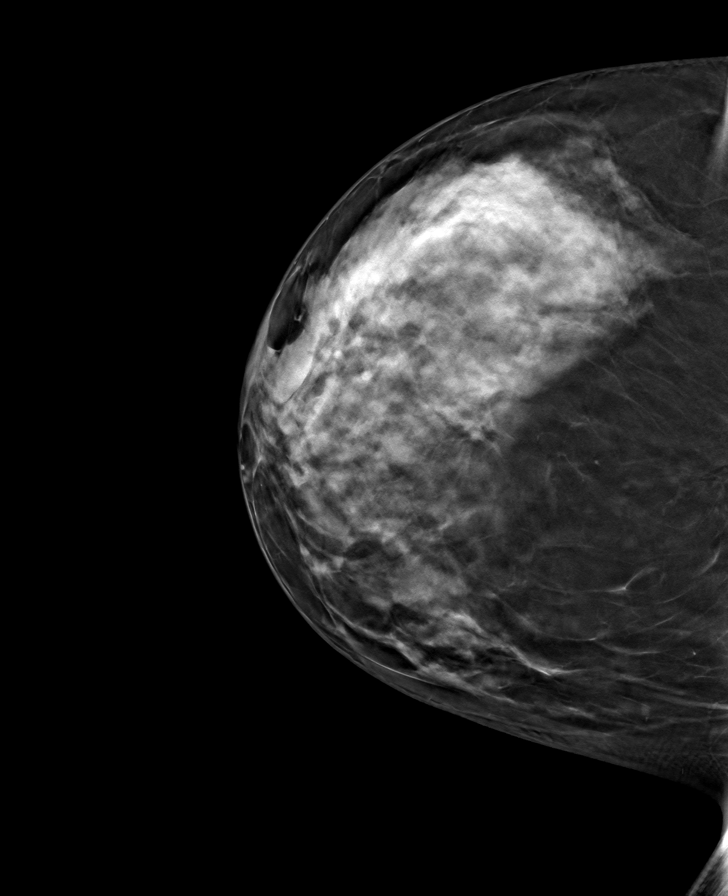

[L CC tomo · tomo slice 41/82.0]
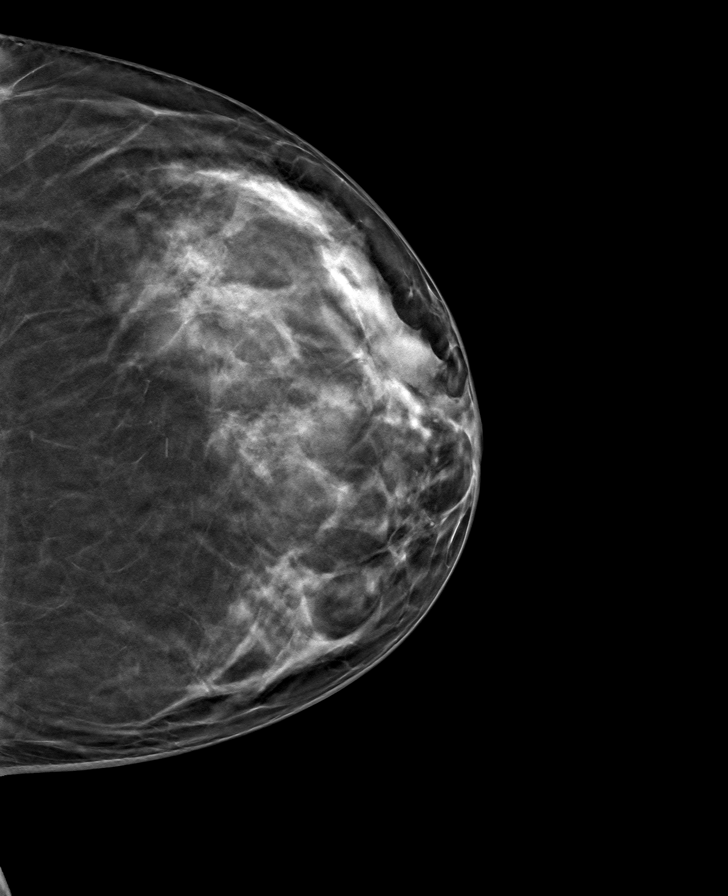

[R MLO tomo · tomo slice 47/92.0]
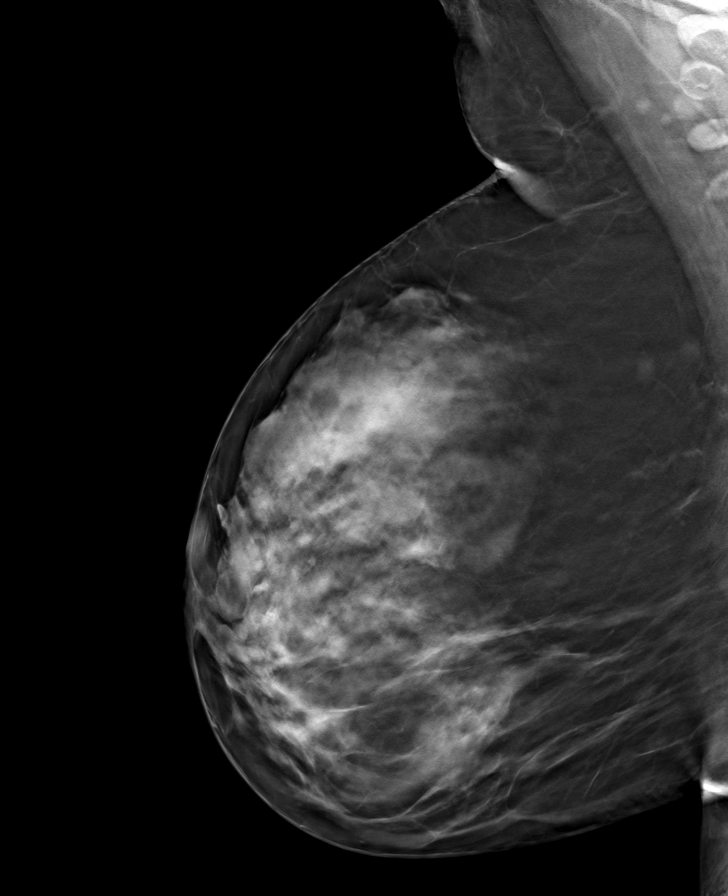

[L MLO tomo · tomo slice 47/92.0]
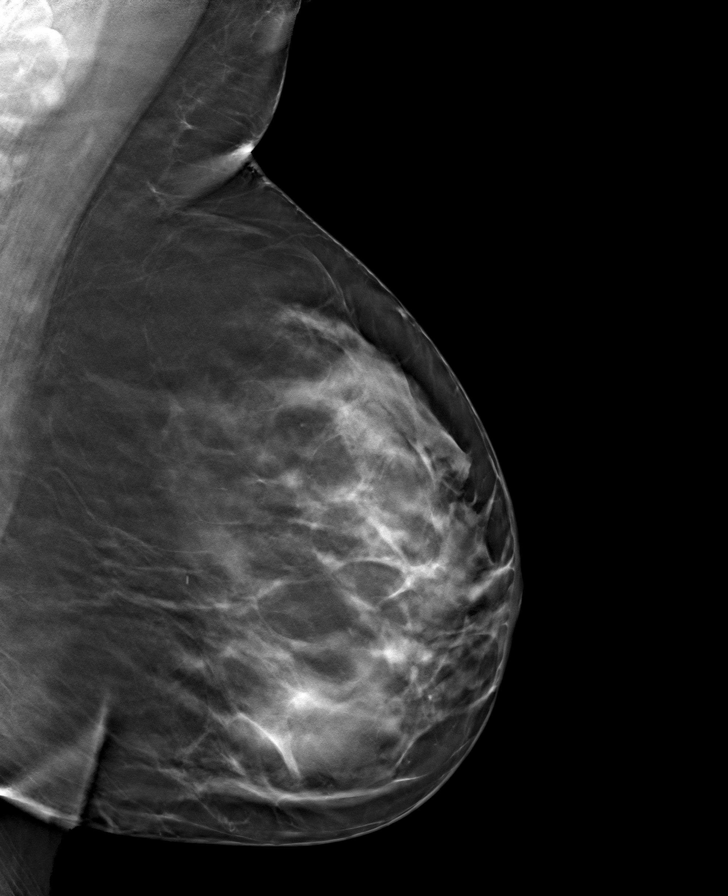

[8 of 24 positions shown; findings below may reference images not displayed]

ACR Breast Density Category c: The breast tissue is heterogeneously
dense, which may obscure small masses.
FINDINGS: There are no findings suspicious for malignancy. Images were
processed with CAD.
IMPRESSION: No mammographic evidence of malignancy. A result letter of this
screening mammogram will be mailed directly to the patient.

RECOMMENDATION:
Screening mammogram in one year. (Code:FT-U-LHB)

BI-RADS CATEGORY  1: Negative.

## 2018-12-08 ENCOUNTER — Ambulatory Visit (INDEPENDENT_AMBULATORY_CARE_PROVIDER_SITE_OTHER)
Admission: RE | Admit: 2018-12-08 | Discharge: 2018-12-08 | Disposition: A | Discharge status: Home / Self Care | Payer: No Typology Code available for payment source | Source: Ambulatory Visit

## 2018-12-08 DIAGNOSIS — S99921A Unspecified injury of right foot, initial encounter: Secondary | ICD-10-CM | POA: Diagnosis not present

## 2018-12-08 NOTE — ED Provider Notes (Signed)
Virtual Visit via Video Note:  Sylvia Meyer  initiated request for Telemedicine visit with [facility] team. I connected with Sylvia Meyer  on 12/08/2018 at 8:49 AM  for a synchronized telemedicine visit using a video enabled HIPPA compliant telemedicine application. I verified that I am speaking with Sylvia Meyer  using two identifiers. Zigmund Gottron, NP  was physically located in a [facility] Urgent care site and Sylvia Meyer was located at a different location.   The limitations of evaluation and management by telemedicine as well as the availability of in-person appointments were discussed. Patient was informed that she  may incur a bill ( including co-pay) for this virtual visit encounter. Sylvia Meyer  expressed understanding and gave verbal consent to proceed with virtual visit.     History of Present Illness:Sylvia Meyer  is a 48 y.o. female presents with complaints of "broken toe" which occurred last night. Stubbed it on the [employer]. Right pinky toe. Buddy taped it, after it was 180 degrees out. States she had to lightly pull it back somewhat straight, but states she didn't force it in any way. Now "turned inward" and "still deformed."  Has stubbed it in the past, unknown if have broken it before. Ice and ibuprofen have managed pain. No numbness or tingling. It is swollen. No bruising. Can move the toe. Doesn't follow with an orthopedist. Without contributing medical history.    Past Medical History:  Diagnosis Date  . Allergy   . Anemia   . Anxiety   . Depression     No Known Allergies      Observations/Objective: Alert, oriented, non toxic in appearance. Clear coherent speech without difficulty. No increased work of breathing visualized.  Right foot pinky toe with ROM intact at MTP joint; no visible bruising; cap refill brisk; mild swelling; toe is generally slightly abducted from; patient endorses full sensation to distal toe  Assessment  and Plan: Concern for fracture of toe. Ice, elevation, buddy tape, supportive shoe. Follow up with orthopedics for further evaluation and treatment, especially if any persistent issues. Return precautions provided. Patient verbalized understanding and agreeable to plan.     Follow Up Instructions:    I discussed the assessment and treatment plan with the patient. The patient was provided an opportunity to ask questions and all were answered. The patient agreed with the plan and demonstrated an understanding of the instructions.   The patient was advised to call back or seek an in-person evaluation if the symptoms worsen or if the condition fails to improve as anticipated.  I provided 11 minutes of non-face-to-face time during this encounter.    Zigmund Gottron, NP  12/08/2018 8:49 AM         Zigmund Gottron, NP 12/08/18 [phone]

## 2018-12-08 NOTE — Discharge Instructions (Addendum)
Ice, elevation, ibuprofen to help with with pain.  Supportive shoe.  Buddy tape. Please follow up with orthopedics as needed for persistent irregularity or pain.  If develop numbness, tingling or otherwise worsening please be seen.

## 2018-12-13 ENCOUNTER — Encounter: Payer: Self-pay | Admitting: Family Medicine

## 2018-12-27 ENCOUNTER — Encounter: Payer: Self-pay | Admitting: Family Medicine

## 2018-12-27 ENCOUNTER — Other Ambulatory Visit: Payer: Self-pay

## 2018-12-27 ENCOUNTER — Ambulatory Visit (INDEPENDENT_AMBULATORY_CARE_PROVIDER_SITE_OTHER): Payer: No Typology Code available for payment source | Admitting: Family Medicine

## 2018-12-27 VITALS — BP 98/64 | HR 64 | Temp 98.9°F | Ht 65.0 in | Wt 192.0 lb

## 2018-12-27 DIAGNOSIS — Z0000 Encounter for general adult medical examination without abnormal findings: Secondary | ICD-10-CM

## 2018-12-27 DIAGNOSIS — Z13220 Encounter for screening for lipoid disorders: Secondary | ICD-10-CM | POA: Diagnosis not present

## 2018-12-27 DIAGNOSIS — N939 Abnormal uterine and vaginal bleeding, unspecified: Secondary | ICD-10-CM | POA: Diagnosis not present

## 2018-12-27 DIAGNOSIS — R350 Frequency of micturition: Secondary | ICD-10-CM | POA: Diagnosis not present

## 2018-12-27 DIAGNOSIS — I83813 Varicose veins of bilateral lower extremities with pain: Secondary | ICD-10-CM

## 2018-12-27 DIAGNOSIS — M549 Dorsalgia, unspecified: Secondary | ICD-10-CM

## 2018-12-27 DIAGNOSIS — G8929 Other chronic pain: Secondary | ICD-10-CM

## 2018-12-27 LAB — HEMOGLOBIN A1C: Hgb A1c MFr Bld: 5.3 % (ref 4.6–6.5)

## 2018-12-27 LAB — CBC WITH DIFFERENTIAL/PLATELET
Basophils Absolute: 0 10*3/uL (ref 0.0–0.1)
Basophils Relative: 0.5 % (ref 0.0–3.0)
Eosinophils Absolute: 0.1 10*3/uL (ref 0.0–0.7)
Eosinophils Relative: 1.3 % (ref 0.0–5.0)
HCT: 38.9 % (ref 36.0–46.0)
Hemoglobin: 12.9 g/dL (ref 12.0–15.0)
Lymphocytes Relative: 26.3 % (ref 12.0–46.0)
Lymphs Abs: 1.6 10*3/uL (ref 0.7–4.0)
MCHC: 33.1 g/dL (ref 30.0–36.0)
MCV: 90.5 fl (ref 78.0–100.0)
Monocytes Absolute: 0.4 10*3/uL (ref 0.1–1.0)
Monocytes Relative: 7.3 % (ref 3.0–12.0)
Neutro Abs: 3.9 10*3/uL (ref 1.4–7.7)
Neutrophils Relative %: 64.6 % (ref 43.0–77.0)
Platelets: 264 10*3/uL (ref 150.0–400.0)
RBC: 4.3 Mil/uL (ref 3.87–5.11)
RDW: 13.8 % (ref 11.5–15.5)
WBC: 6 10*3/uL (ref 4.0–10.5)

## 2018-12-27 LAB — T4, FREE: Free T4: 0.81 ng/dL (ref 0.60–1.60)

## 2018-12-27 LAB — BASIC METABOLIC PANEL
BUN: 14 mg/dL (ref 6–23)
CO2: 28 mEq/L (ref 19–32)
Calcium: 8.8 mg/dL (ref 8.4–10.5)
Chloride: 103 mEq/L (ref 96–112)
Creatinine, Ser: 0.8 mg/dL (ref 0.40–1.20)
GFR: 76.32 mL/min (ref >60.00)
Glucose, Bld: 82 mg/dL (ref 70–99)
Potassium: 4.5 mEq/L (ref 3.5–5.1)
Sodium: 138 mEq/L (ref 135–145)

## 2018-12-27 LAB — LIPID PANEL
Cholesterol: 175 mg/dL (ref 0–200)
HDL: 52.1 mg/dL (ref >39.00)
LDL Cholesterol: 104 mg/dL — ABNORMAL HIGH (ref 0–99)
NonHDL: 122.43
Total CHOL/HDL Ratio: 3
Triglycerides: 91 mg/dL (ref 0.0–149.0)
VLDL: 18.2 mg/dL (ref 0.0–40.0)

## 2018-12-27 LAB — TSH: TSH: 2.72 u[IU]/mL (ref 0.35–4.50)

## 2018-12-27 MED ORDER — BUPROPION HCL ER (XL) 150 MG PO TB24: 150.0000 mg | ORAL_TABLET | Freq: Every day | ORAL | 3 refills | Status: DC

## 2018-12-27 MED FILL — buPROPion HCL ER (XL) 150 M: 150 | 90 days supply | Qty: 90 | Fill #0

## 2018-12-27 NOTE — Progress Notes (Signed)
Subjective:     Sylvia Meyer is a 48 y.o. female and is here for a comprehensive physical exam. The patient reports problems - Varicose veins, AUB, grief.  Pt states she is having occasional pain from her varicose veins in b/l LEs.  Pt was seen by vascular surg, who wanted to do a procedure.  Pt needs to f/u.  Pt notes irregular menses, every 3-6 wks.  Recently had heavy bleeding x 3 days between periods.  Pt also notes intense abdominal cramping, urinary frequency, and a heaviness in legs/leg pain near menses.  Denies dizziness, h/o fibroids, endometriosis, or thyroid dysfunction.  In the past OCPs made pt "crazy".    Pt states she is maintaining.  She lost her mother in February.  Pt also notes her daughter has come out as transsexual.  Pt feels like she is dealing with the loss of her daughter as well, but is supportive of his decision.  Pt requesting referral to chiropractor-Dr. Spell, vascular surgery, OB/GYN-Dr. Dellis Filbert.  Pt seen by these providers in the past however her new insurance requires referrals.  Mammogram to be scheduled next month.  Hep C and hep B test negative in 2018.  Pap. Social History   Socioeconomic History  . Marital status: Single    Spouse name: Not on file  . Number of children: Not on file  . Years of education: Not on file  . Highest education level: Not on file  Occupational History  . Not on file  Social Needs  . Financial resource strain: Not on file  . Food insecurity    Worry: Not on file    Inability: Not on file  . Transportation needs    Medical: Not on file    Non-medical: Not on file  Tobacco Use  . Smoking status: Former Smoker    Types: Cigarettes    Quit date: 01/05/2007    Years since quitting: 11.9  . Smokeless tobacco: Never Used  Substance and Sexual Activity  . Alcohol use: Yes    Alcohol/week: 0.0 standard drinks    Comment: occ  . Drug use: No  . Sexual activity: Not Currently    Partners: Male    Comment: 1st  intercourse- 89, partners- more than 10  Lifestyle  . Physical activity    Days per week: Not on file    Minutes per session: Not on file  . Stress: Not on file  Relationships  . Social Herbalist on phone: Not on file    Gets together: Not on file    Attends religious service: Not on file    Active member of club or organization: Not on file    Attends meetings of clubs or organizations: Not on file    Relationship status: Not on file  . Intimate partner violence    Fear of current or ex partner: Not on file    Emotionally abused: Not on file    Physically abused: Not on file    Forced sexual activity: Not on file  Other Topics Concern  . Not on file  Social History Narrative  . Not on file   Health Maintenance  Topic Date Due  . INFLUENZA VACCINE  01/13/2019  . PAP SMEAR-Modifier  01/05/2020  . TETANUS/TDAP  07/24/2024  . HIV Screening  Completed    The following portions of the patient's history were reviewed and updated as appropriate: allergies, current medications, past family history, past medical history, past social  history, past surgical history and problem list.  Review of Systems Pertinent items noted in HPI and remainder of comprehensive ROS otherwise negative.   Objective:    BP 98/64 (BP Location: Left Arm, Patient Position: Sitting, Cuff Size: Large)   Pulse 64   Temp 98.9 F (37.2 C) (Oral)   Ht 5\' 5"  (1.651 m)   Wt 192 lb (87.1 kg)   LMP  (LMP Unknown) Comment: irregular   SpO2 98%   BMI 31.95 kg/m  General appearance: alert, cooperative and no distress Head: Normocephalic, without obvious abnormality, atraumatic Eyes: conjunctivae/corneas clear. PERRL, EOM's intact. Fundi benign. Ears: normal TM's and external ear canals both ears Nose: Nares normal. Septum midline. Mucosa normal. No drainage or sinus tenderness. Throat: lips, mucosa, and tongue normal; teeth and gums normal Neck: no adenopathy, no carotid bruit, no JVD, supple,  symmetrical, trachea midline and thyroid not enlarged, symmetric, no tenderness/mass/nodules Lungs: clear to auscultation bilaterally Heart: regular rate and rhythm, S1, S2 normal, no murmur, click, rub or gallop Abdomen: soft, non-tender; bowel sounds normal; no masses,  no organomegaly Extremities: extremities normal, atraumatic, no cyanosis or edema Pulses: 2+ and symmetric Skin: Skin color, texture, turgor normal. No rashes or lesions.  Varicose veins b/l LEs, no ulcers or erythema noted. Lymph nodes: Cervical, supraclavicular, and axillary nodes normal. Neurologic: Alert and oriented X 3, normal strength and tone. Normal symmetric reflexes. Normal coordination and gait    Assessment:    Healthy female exam with AUB and varicose veins.     Plan:     Anticipatory guidance given including wearing seatbelts, smoke detectors in the home, increasing physical activity, increasing p.o. intake of water and vegetables. -will obtain labs -mammogram to be scheduled next month -pap up to date.  Done 01/04/2017.  Due 2023 -immunizations up to date -given handouts See After Visit Summary for Counseling Recommendations    Urinary frequency -Hgb A1C  AUB -discussed possible causes -dicussed obtaining u/s.  Pt decided to wait until OB/Gyn appt. -referral to OB/Gyn placed -will obtain CBC, TSH, free T4  Chronic back pain -Referral placed to chiropractor Dr. Teryl Lucy  Varicose veins of both lower extremities with pain -Referral placed to vascular surgery  F/u prn  Grier Mitts, MD

## 2018-12-27 NOTE — Patient Instructions (Signed)
Preventive Care 8-49 Years Old, Female Preventive care refers to visits with your health care provider and lifestyle choices that can promote health and wellness. This includes:  A yearly physical exam. This may also be called an annual well check.  Regular dental visits and eye exams.  Immunizations.  Screening for certain conditions.  Healthy lifestyle choices, such as eating a healthy diet, getting regular exercise, not using drugs or products that contain nicotine and tobacco, and limiting alcohol use. What can I expect for my preventive care visit? Physical exam Your health care provider will check your:  Height and weight. This may be used to calculate body mass index (BMI), which tells if you are at a healthy weight.  Heart rate and blood pressure.  Skin for abnormal spots. Counseling Your health care provider may ask you questions about your:  Alcohol, tobacco, and drug use.  Emotional well-being.  Home and relationship well-being.  Sexual activity.  Eating habits.  Work and work Statistician.  Method of birth control.  Menstrual cycle.  Pregnancy history. What immunizations do I need?  Influenza (flu) vaccine  This is recommended every year. Tetanus, diphtheria, and pertussis (Tdap) vaccine  You may need a Td booster every 10 years. Varicella (chickenpox) vaccine  You may need this if you have not been vaccinated. Zoster (shingles) vaccine  You may need this after age 71. Measles, mumps, and rubella (MMR) vaccine  You may need at least one dose of MMR if you were born in 1957 or later. You may also need a second dose. Pneumococcal conjugate (PCV13) vaccine  You may need this if you have certain conditions and were not previously vaccinated. Pneumococcal polysaccharide (PPSV23) vaccine  You may need one or two doses if you smoke cigarettes or if you have certain conditions. Meningococcal conjugate (MenACWY) vaccine  You may need this if you  have certain conditions. Hepatitis A vaccine  You may need this if you have certain conditions or if you travel or work in places where you may be exposed to hepatitis A. Hepatitis B vaccine  You may need this if you have certain conditions or if you travel or work in places where you may be exposed to hepatitis B. Haemophilus influenzae type b (Hib) vaccine  You may need this if you have certain conditions. Human papillomavirus (HPV) vaccine  If recommended by your health care provider, you may need three doses over 6 months. You may receive vaccines as individual doses or as more than one vaccine together in one shot (combination vaccines). Talk with your health care provider about the risks and benefits of combination vaccines. What tests do I need? Blood tests  Lipid and cholesterol levels. These may be checked every 5 years, or more frequently if you are over 80 years old.  Hepatitis C test.  Hepatitis B test. Screening  Lung cancer screening. You may have this screening every year starting at age 64 if you have a 30-pack-year history of smoking and currently smoke or have quit within the past 15 years.  Colorectal cancer screening. All adults should have this screening starting at age 76 and continuing until age 43. Your health care provider may recommend screening at age 67 if you are at increased risk. You will have tests every 1-10 years, depending on your results and the type of screening test.  Diabetes screening. This is done by checking your blood sugar (glucose) after you have not eaten for a while (fasting). You may have this  done every 1-3 years.  Mammogram. This may be done every 1-2 years. Talk with your health care provider about when you should start having regular mammograms. This may depend on whether you have a family history of breast cancer.  BRCA-related cancer screening. This may be done if you have a family history of breast, ovarian, tubal, or peritoneal  cancers.  Pelvic exam and Pap test. This may be done every 3 years starting at age 58. Starting at age 40, this may be done every 5 years if you have a Pap test in combination with an HPV test. Other tests  Sexually transmitted disease (STD) testing.  Bone density scan. This is done to screen for osteoporosis. You may have this scan if you are at high risk for osteoporosis. Follow these instructions at home: Eating and drinking  Eat a diet that includes fresh fruits and vegetables, whole grains, lean protein, and low-fat dairy.  Take vitamin and mineral supplements as recommended by your health care provider.  Do not drink alcohol if: ? Your health care provider tells you not to drink. ? You are pregnant, may be pregnant, or are planning to become pregnant.  If you drink alcohol: ? Limit how much you have to 0-1 drink a day. ? Be aware of how much alcohol is in your drink. In the [place] one drink equals one 12 oz bottle of beer (355 mL), one 5 oz glass of wine (148 mL), or one 1 oz glass of hard liquor (44 mL). Lifestyle  Take daily care of your teeth and gums.  Stay active. Exercise for at least 30 minutes on 5 or more days each week.  Do not use any products that contain nicotine or tobacco, such as cigarettes, e-cigarettes, and chewing tobacco. If you need help quitting, ask your health care provider.  If you are sexually active, practice safe sex. Use a condom or other form of birth control (contraception) in order to prevent pregnancy and STIs (sexually transmitted infections).  If told by your health care provider, take low-dose aspirin daily starting at age 74. What's next?  Visit your health care provider once a year for a well check visit.  Ask your health care provider how often you should have your eyes and teeth checked.  Stay up to date on all vaccines. This information is not intended to replace advice given to you by your health care provider. Make sure you  discuss any questions you have with your health care provider. Document Released: 06/27/2015 Document Revised: 02/09/2018 Document Reviewed: 02/09/2018 Elsevier Patient Education  [employer].  Abnormal Uterine Bleeding Abnormal uterine bleeding is unusual bleeding from the uterus. It includes:  Bleeding or spotting between periods.  Bleeding after sex.  Bleeding that is heavier than normal.  Periods that last longer than usual.  Bleeding after menopause. Abnormal uterine bleeding can affect women at various stages in life, including teenagers, women in their reproductive years, pregnant women, and women who have reached menopause. Common causes of abnormal uterine bleeding include:  Pregnancy.  Growths of tissue (polyps).  A noncancerous tumor in the uterus (fibroid).  Infection.  Cancer.  Hormonal imbalances. Any type of abnormal bleeding should be evaluated by a health care provider. Many cases are minor and simple to treat, while others are more serious. Treatment will depend on the cause of the bleeding. Follow these instructions at home:  Monitor your condition for any changes.  Do not use tampons, douche, or have sex if  told by your health care provider.  Change your pads often.  Get regular exams that include pelvic exams and cervical cancer screening.  Keep all follow-up visits as told by your health care provider. This is important. Contact a health care provider if:  Your bleeding lasts for more than one week.  You feel dizzy at times.  You feel nauseous or you vomit. Get help right away if:  You pass out.  Your bleeding soaks through a pad every hour.  You have abdominal pain.  You have a fever.  You become sweaty or weak.  You pass large blood clots from your vagina. Summary  Abnormal uterine bleeding is unusual bleeding from the uterus.  Any type of abnormal bleeding should be evaluated by a health care provider. Many cases are  minor and simple to treat, while others are more serious.  Treatment will depend on the cause of the bleeding. This information is not intended to replace advice given to you by your health care provider. Make sure you discuss any questions you have with your health care provider. Document Released: 05/31/2005 Document Revised: 09/07/2017 Document Reviewed: 07/02/2016 Elsevier Patient Education  2020 [employer].  Nonsurgical Procedures for Varicose Veins Various nonsurgical procedures can be used to treat varicose veins. Varicose veins are swollen, twisted veins that are visible under the skin. They occur most often in the legs. These veins may appear blue and bulging. Varicose veins are caused by damage to the valves in veins. All veins have a valve that makes blood flow in only one direction. If a valve gets weak or damaged, blood can pool and cause varicose veins. You may need a procedure to treat your varicose veins if they are causing symptoms or complications, or if lifestyle changes have not helped. These procedures can reduce pain, aching, and the risk of bleeding and blood clots. They can also improve the way the affected area looks (cosmetic appearance). The three common nonsurgical procedures are:  Sclerotherapy. A chemical is injected to close off a vein.  Laser treatment. Light energy is applied to close off the vein.  Radiofrequency vein ablation. Electrical energy is used to produce heat that closes off the vein. Your health care provider will discuss the method that is best for you based on your condition. Tell a health care provider about:  Any allergies you have.  All medicines you are taking, including vitamins, herbs, eye drops, creams, and over-the-counter medicines.  Any problems you or family members have had with anesthetic medicines.  Any blood disorders you have.  Any surgeries you have had.  Any medical conditions you have.  Whether you are pregnant or  may be pregnant. What are the risks? Generally, this is a safe procedure. However, problems may occur, including:  Damage to nearby nerves, tissues, or veins.  Skin irritation, sores, or dark spots.  Numbness.  Clotting.  Infection.  Allergic reactions to medicines.  Scarring.  Leg swelling.  Need for additional treatments.  Bruising. What happens before the procedure?  Ask your health care provider about: ? Changing or stopping your regular medicines. This is especially important if you are taking diabetes medicines or blood thinners. ? Taking over-the-counter medicines, vitamins, herbs, and supplements. ? Taking medicines such as aspirin and ibuprofen. These medicines can thin your blood. Do not take these medicines unless your health care provider tells you to take them.  You may have an exam or testing. This can include a tests to: ? Check for  clots and check blood flow using sound waves (Doppler ultrasound). ? Observe how blood flows through your veins by injecting a dye that outlines your veins on X-rays (angiogram). This test is used in rare cases. What happens during the procedure? One of the following procedures will be performed: Sclerotherapy This procedure is often used for small to medium veins.  A chemical (sclerosant) that irritates the lining of the vein will be injected into the vein. This will cause the varicose vein to be closed off. Sclerosants in different amounts and strengths can be used, depending on the size and location of the vein.  All of the varicose vein sites will be injected. You may need more than one treatment because new varicose veins may develop, or more than one injection may be needed for each varicose vein.  Laser treatment There are two ways that lasers are used to treat varicose veins:  Light energy from a laser may be directed onto the vein through the skin.  A needle may be used to pass a thin laser catheter into the vein to  cause it to close. You may need more than one treatment if the vein re-opens. In some cases, laser treatment may be combined with sclerotherapy. Radiofrequency vein ablation   You will be given a medicine that numbs the area (local anesthetic).  A small incision will be made near the varicose vein.  A thin tube (catheter) will be threaded into your vein.  The tip of the catheter will deploy electrodes.  The electrodes will deliver electrical energy to produce heat that closes off the vein. What happens after the procedure?  A bandage (dressing) may be used to cover the injection site or incisions.  You may have to wear compression stockings. These stockings help to prevent blood clots and reduce swelling in your legs.  Return to your normal activities as told by your health care provider. Summary  Varicose veins are swollen, twisted veins that are visible under the skin. They occur most often in the legs.  Various procedures can be used to treat varicose veins. You may need a procedure to treat your varicose veins if they are causing symptoms or complications, or if lifestyle changes have not helped.  Your health care provider will discuss the method that is best for you based on your condition. This information is not intended to replace advice given to you by your health care provider. Make sure you discuss any questions you have with your health care provider. Document Released: 09/10/2016 Document Revised: 09/22/2018 Document Reviewed: 09/10/2016 Elsevier Patient Education  [place] Need to Know About Gender Identity Gender identity is a person's inner sense of being female, female, both, or neither. It determines how you see yourself in the world and how you want to be seen by others. Gender identity may change or develop over time. What is biological sex? Biological sex is assigned to you at birth based on whether you have female or female sex genitals  (genitalia) and sex organs. Gender identity is not the same as sexual identity. For most people, their gender identity matches (aligns with) their biological sex (cisgender). For others, their biological sex is different from their gender identity (transgender). A person may realize that he or she is transgender in childhood or later in life. Transgender people come from every ethnic and religious background. They are part of communities across the country and around the world. What happens when gender identity does not align  with biological sex? Some people take actions to live as a person of their gender identity. This may include:  Wearing clothes or doing activities that are typical of people with their gender identity.  Dressing and openly behaving as someone with their gender identity.  Getting therapy to help make the transition to live as a person with their gender identity.  Taking hormone medicines to change their outer looks to match their gender identity.  Having surgery to change their anatomy to match their gender identity. Gender misalignment is not a mental health disorder, but the distress of it can cause mental health problems. If distress lasts for at least 6 months and makes it hard to function in daily life, it can lead to a mental health condition (gender dysphoria). Gender dysphoria can be helped with mental health treatment. It may be resolved with physical gender reassignment through hormone treatments or surgery. A person experiencing gender dysphoria may:  Want to be treated as a person of the other sex.  Insist that he or she is actually a person of the other sex.  Dislike his or her sexual anatomy.  Believe that he or she is in the wrong body. What are some challenges of gender misalignment? Gender misalignment can cause many challenges. These vary from person to person. Common challenges include:  Not being accepted by others.  Being bullied, teased,  disliked, or abused.  Being denied equal rights (discrimination).  Being targeted for hate crimes.  Having trouble getting insurance coverage for treatment.  Anxiety or depression. This may involve suicidal thoughts (ideation) or suicide attempts.  High levels of homelessness and unemployment. How can I support someone who is transgender?  Maintain open, honest communication with the person.  Learn as much as you can about gender identity issues.  Do not discriminate against people who have a different gender identity.  Create an environment that is supportive and respectful. Allow zero tolerance for disrespect, negative comments, or pressure to conform.  Express love and support for the person's efforts to express their gender identity.  Use the names and pronouns that the person prefers.  Accept gender identity issues as a difference and not a disorder.  Be aware of your own attitudes and prejudices. It may help: ? To work on your attitudes so that you can better support the person. ? To learn more and talk with others to help you manage your feelings. It is not the job of the transgender person to help you with this.  If you are struggling to deal with a friend or loved one's gender identity, get counseling. Where to find support  Your local transgender advocacy organization.  A therapist or psychologist who has expertise in gender identity issues.  [employer]: www.transequality.org  Gender Spectrum: genderspectrum.org/lounge  [employer]: www.thetrevorproject.org Where to find more information  Human Rights Campaign: https://fox-cook.com/  American Psychological Association: TVStereos.ch  American Academy of Pediatrics: www.healthychildren.org Summary  Gender identity is the inner sense of being female, female, a combination of both, or neither. Biological sex is assigned at birth based on genitals and sex organs.  For most people,  their gender identity matches (aligns with) their biological sex (cisgender). For others, their biological sex is different from their gender identity (transgender).  To support someone who is transgender, maintain open, honest communication with the person.  If you are struggling to deal with a friend or loved one's gender identity, get counseling. This information is not intended to  replace advice given to you by your health care provider. Make sure you discuss any questions you have with your health care provider. Document Released: 05/17/2012 Document Revised: 07/21/2018 Document Reviewed: 05/15/2016 Elsevier Patient Education  2020 [employer].

## 2019-01-01 ENCOUNTER — Other Ambulatory Visit: Payer: Self-pay | Admitting: Obstetrics & Gynecology

## 2019-01-01 DIAGNOSIS — Z1231 Encounter for screening mammogram for malignant neoplasm of breast: Secondary | ICD-10-CM

## 2019-01-04 NOTE — Telephone Encounter (Signed)
Appointment scheduled for July 28 to see Dr Dellis Filbert to check infection.

## 2019-01-08 ENCOUNTER — Other Ambulatory Visit: Payer: Self-pay

## 2019-01-09 ENCOUNTER — Encounter: Payer: Self-pay | Admitting: Obstetrics & Gynecology

## 2019-01-09 ENCOUNTER — Ambulatory Visit: Payer: No Typology Code available for payment source | Admitting: Obstetrics & Gynecology

## 2019-01-09 VITALS — BP 102/68

## 2019-01-09 DIAGNOSIS — N898 Other specified noninflammatory disorders of vagina: Secondary | ICD-10-CM

## 2019-01-09 LAB — WET PREP FOR TRICH, YEAST, CLUE

## 2019-01-09 MED ORDER — TINIDAZOLE 500 MG PO TABS: 2.0000 g | ORAL_TABLET | Freq: Every day | ORAL | 3 refills | Status: AC

## 2019-01-09 MED ORDER — FLUCONAZOLE 150 MG PO TABS: 150.0000 mg | ORAL_TABLET | Freq: Every day | ORAL | 3 refills | Status: AC

## 2019-01-09 MED FILL — TINIDAZOLE 500 MG TAB: 500 | 2 days supply | Qty: 8 | Fill #0

## 2019-01-09 MED FILL — FLUCONAZOLE 150 MG TABS: 150 | 3 days supply | Qty: 3 | Fill #0

## 2019-01-09 NOTE — Progress Notes (Signed)
    Sylvia Meyer 04/19/1971 [id number]        48 y.o.  [id number]  Single.  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  RP: Vaginal irritation and odor on-off x many months  HPI: C/O increased vaginal discharge with irritation and odor on-off x many months.  Recently has coincided more with her menstrual period.  Menses regular normal every month.  No BTB.  No pelvic pain.  Abstinent.     OB History  Gravida Para Term Preterm AB Living  4 1     3 1   SAB TAB Ectopic Multiple Live Births               # Outcome Date GA Lbr Len/2nd Weight Sex Delivery Anes PTL Lv  4 AB           3 AB           2 AB           1 Para             Past medical history,surgical history, problem list, medications, allergies, family history and social history were all reviewed and documented in the EPIC chart.   Directed ROS with pertinent positives and negatives documented in the history of present illness/assessment and plan.  Exam:  Vitals:   01/09/19 1406  BP: 102/68   General appearance:  Normal  Gynecologic exam: Vulva normal.  Speculum:  Cervix/Vagina normal.  Increased vaginal d/c.  Wet prep done.  Wet prep:  Clue cells present   Assessment/Plan:  48 y.o. G4P0031   1. Vaginal odor Bacterial vaginosis confirmed by Wet prep.  Decision to treat with Tinidazole.  Usage reviewed and prescription sent to pharmacy.  Will use Fluconazole after to prevent/treat Yeast Vaginitis as needed. - WET PREP FOR [place], YEAST, CLUE  Other orders - tinidazole (TINDAMAX) 500 MG tablet; Take 4 tablets (2,000 mg total) by mouth daily for 2 days. - fluconazole (DIFLUCAN) 150 MG tablet; Take 1 tablet (150 mg total) by mouth daily for 3 days.  Counseling on above issues and coordination of care more than 50% for 15 minutes.  Princess Bruins MD, 2:16 PM 01/09/2019

## 2019-01-12 ENCOUNTER — Encounter: Payer: Self-pay | Admitting: Obstetrics & Gynecology

## 2019-01-12 NOTE — Patient Instructions (Signed)
1. Vaginal odor Bacterial vaginosis confirmed by Wet prep.  Decision to treat with Tinidazole.  Usage reviewed and prescription sent to pharmacy.  Will use Fluconazole after to prevent/treat Yeast Vaginitis as needed. - WET PREP FOR [place], YEAST, CLUE  Other orders - tinidazole (TINDAMAX) 500 MG tablet; Take 4 tablets (2,000 mg total) by mouth daily for 2 days. - fluconazole (DIFLUCAN) 150 MG tablet; Take 1 tablet (150 mg total) by mouth daily for 3 days.  Elmyra Ricks, it was a pleasure seeing you today!

## 2019-01-18 ENCOUNTER — Telehealth: Payer: No Typology Code available for payment source | Admitting: Physician Assistant

## 2019-01-18 DIAGNOSIS — R300 Dysuria: Secondary | ICD-10-CM

## 2019-01-18 DIAGNOSIS — R109 Unspecified abdominal pain: Secondary | ICD-10-CM

## 2019-01-18 NOTE — Progress Notes (Signed)
Based on what you shared with me, I feel your condition warrants further evaluation and I recommend that you be seen for a face to face office visit.  Giving recent treatment for bacterial vaginosis in addition to back pain and belly pain you will need to be seen in person for further assessment.   NOTE: If you entered your credit card information for this eVisit, you will not be charged. You may see a "hold" on your card for the $35 but that hold will drop off and you will not have a charge processed.  If you are having a true medical emergency please call 911.     For an urgent face to face visit, [place] has five urgent care centers for your convenience:    [email or web address] to reserve your spot online an avoid wait times  Sylvia Meyer (New Address!) [address] [place], [place] [address] *Just off [address], across the road from [place] hours of operation: Monday-Friday, 12 PM to 6 PM  Closed Saturday & Sunday   The following sites will take your insurance:  . [facility]    [phone]                  Get Driving Directions  [address], [place] [address] . 10 am to 8 pm Monday-Friday . 12 pm to 8 pm Saturday-Sunday   . [facility] at [place]                  Get Driving Directions  [address], [place], [place] [address] . 8 am to 8 pm Monday-Friday . 9 am to 6 pm Saturday . 11 am to 6 pm Sunday   . [facility] at [place]                  Get Driving Directions   [address] [place], [place] [address] . 8 am to 8 pm Monday-Friday . 8 am to 4 pm Saturday-Sunday    . [facility] [place]                    Get Driving Directions  [phone]  [address] [place], [place] [address]  . Monday-Friday, 12 PM to 6 PM    Your  e-visit answers were reviewed by a board certified advanced clinical practitioner to complete your personal care plan.  Thank you for using e-Visits.

## 2019-01-24 MED FILL — FLUCONAZOLE 150 MG TABS: 150 | 3 days supply | Qty: 3 | Fill #1

## 2019-01-29 ENCOUNTER — Other Ambulatory Visit: Payer: Self-pay

## 2019-01-29 DIAGNOSIS — I83893 Varicose veins of bilateral lower extremities with other complications: Secondary | ICD-10-CM

## 2019-02-06 ENCOUNTER — Other Ambulatory Visit: Payer: Self-pay

## 2019-02-06 ENCOUNTER — Ambulatory Visit (INDEPENDENT_AMBULATORY_CARE_PROVIDER_SITE_OTHER): Payer: No Typology Code available for payment source | Admitting: Vascular Surgery

## 2019-02-06 ENCOUNTER — Encounter: Payer: Self-pay | Admitting: Vascular Surgery

## 2019-02-06 ENCOUNTER — Ambulatory Visit (HOSPITAL_COMMUNITY)
Admission: RE | Admit: 2019-02-06 | Discharge: 2019-02-06 | Disposition: A | Discharge status: Home / Self Care | Payer: No Typology Code available for payment source | Source: Ambulatory Visit | Attending: Family | Admitting: Family

## 2019-02-06 VITALS — BP 93/62 | HR 57 | Temp 98.5°F | Resp 14 | Ht 64.0 in | Wt 182.0 lb

## 2019-02-06 DIAGNOSIS — I872 Venous insufficiency (chronic) (peripheral): Secondary | ICD-10-CM | POA: Diagnosis not present

## 2019-02-06 DIAGNOSIS — I83893 Varicose veins of bilateral lower extremities with other complications: Secondary | ICD-10-CM | POA: Diagnosis not present

## 2019-02-06 NOTE — Progress Notes (Signed)
Patient name: Sylvia Meyer MRN: [id number] DOB: 09-Oct-1970 Sex: female  REASON FOR CONSULT: Re-evaluate varicose veins  HPI: Sylvia Meyer is a 48 y.o. female, with history of anxiety and depression that presents for evaluation of bilateral lower extremity varicose veins.  Patient states her legs have been hurting since the beginning part of this year.  She has a lot of achiness and heaviness in bilateral calves and thighs.  One leg is not particularly worse than the other.  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  She is wearing knee-high compression but states she cannot tolerate thigh-high compression.  No previous lower extremity interventions from a vein standpoint.  She did see Dr. Bridgett Larsson last year who recommended compression and follow-up in 3 months with the vein center to see if she would be a candidate for any endovenous ablation.  Most bothersome for her are spider veins behind both knees.  No previous DVT.  No previous trauma.  Past Medical History:  Diagnosis Date  . Allergy   . Anemia   . Anxiety   . Depression     History reviewed. No pertinent surgical history.  Family History  Problem Relation Age of Onset  . Cancer Mother   . Mental illness Mother   . Hyperlipidemia Mother   . Breast cancer Mother   . Migraines Mother   . Depression Father   . Heart disease Father   . Hyperlipidemia Father   . Hypertension Father   . Stroke Father   . Diabetes Father     SOCIAL HISTORY: Social History   Socioeconomic History  . Marital status: Single    Spouse name: Not on file  . Number of children: Not on file  . Years of education: Not on file  . Highest education level: Not on file  Occupational History  . Not on file  Social Needs  . Financial resource strain: Not on file  . Food insecurity    Worry: Not on file    Inability: Not on file  . Transportation needs    Medical: Not on file   Non-medical: Not on file  Tobacco Use  . Smoking status: Former Smoker    Types: Cigarettes    Quit date: 01/05/2007    Years since quitting: 12.0  . Smokeless tobacco: Never Used  Substance and Sexual Activity  . Alcohol use: Yes    Alcohol/week: 0.0 standard drinks    Comment: occ  . Drug use: No  . Sexual activity: Not Currently    Partners: Male    Comment: 1st intercourse- 63, partners- more than 10  Lifestyle  . Physical activity    Days per week: Not on file    Minutes per session: Not on file  . Stress: Not on file  Relationships  . Social Herbalist on phone: Not on file    Gets together: Not on file    Attends religious service: Not on file    Active member of club or organization: Not on file    Attends meetings of clubs or organizations: Not on file    Relationship status: Not on file  . Intimate partner violence    Fear of current or ex partner: Not on file    Emotionally abused: Not on file    Physically abused: Not on file    Forced sexual activity: Not on file  Other Topics Concern  . Not on file  Social History Narrative  . Not on file    Allergies  Allergen Reactions  . Kelp-B6-Lecithin-Vinegar Other (See Comments)    Current Outpatient Medications  Medication Sig Dispense Refill  . buPROPion (WELLBUTRIN XL) 150 MG 24 hr tablet Take 1 tablet (150 mg total) by mouth daily. 90 tablet 3  . Cholecalciferol (VITAMIN D3) 2000 units TABS Take by mouth daily.    . fluticasone (FLONASE) 50 MCG/ACT nasal spray Place 2 sprays into both nostrils daily. 16 g 6  . vitamin B-12 (CYANOCOBALAMIN) 100 MCG tablet Take 100 mcg by mouth daily.    . fluconazole (DIFLUCAN) 150 MG tablet     . Homeopathic Products (AZO YEAST PLUS PO)      No current facility-administered medications for this visit.     REVIEW OF SYSTEMS:  [X]  denotes positive finding, [ ]  denotes negative finding Cardiac  Comments:  Chest pain or chest pressure:    Shortness of breath  upon exertion:    Short of breath when lying flat:    Irregular heart rhythm:        Vascular    Pain in calf, thigh, or hip brought on by ambulation:    Pain in feet at night that wakes you up from your sleep:     Blood clot in your veins:    Leg swelling:         Pulmonary    Oxygen at home:    Productive cough:     Wheezing:         Neurologic    Sudden weakness in arms or legs:     Sudden numbness in arms or legs:     Sudden onset of difficulty speaking or slurred speech:    Temporary loss of vision in one eye:     Problems with dizziness:         Gastrointestinal    Blood in stool:     Vomited blood:         Genitourinary    Burning when urinating:     Blood in urine:        Psychiatric    Major depression:         Hematologic    Bleeding problems:    Problems with blood clotting too easily:        Skin    Rashes or ulcers:        Constitutional    Fever or chills:      PHYSICAL EXAM: Vitals:   02/06/19 0939  BP: 93/62  Pulse: (!) 57  Resp: 14  Temp: 98.5 F (36.9 C)  TempSrc: Temporal  SpO2: 98%  Weight: 182 lb (82.6 kg)  Height: 5\' 4"  (1.626 m)    GENERAL: The patient is a well-nourished female, in no acute distress. The vital signs are documented above. CARDIAC: There is a regular rate and rhythm.  VASCULAR:  Palpable radial pulses bilaterally Palpable femoral pulses bilaterally Palpable PT pulses bilaterlly Spider veins behind both knees PULMONARY: There is good air exchange bilaterally without wheezing or rales. ABDOMEN: Soft and non-tender with normal pitched bowel sounds.  MUSCULOSKELETAL: There are no major deformities or cyanosis. NEUROLOGIC: No focal weakness or paresthesias are detected. SKIN: There are no ulcers or rashes noted. PSYCHIATRIC: The patient has a normal affect.  DATA:   I independently reviewed her venous reflux study and she has abnormal reflux times in both common femoral veins as well  as the left popliteal  vein.  The only superficial reflux is at the left saphenofemoral junction.  Assessment/Plan:  48 year old female presents with venous insufficiency of the bilateral lower extremities with CEAP classification C2.  Discussed that after review of her updated duplex the majority of her reflux is in the deep venous system.  Discussed that we really cannot provide any intervention for this given its location and best be served with conservative measures like leg elevation and compression.  I do think she would benefit from going from knee-high to thigh-high compression given that a lot of her reflux in the common femoral and popliteal veins and proximal to her current compression socks.  Recommended 20-30 mm Hg compression rating.  Given only a focal segment of superficial reflux of the left saphenofemoral junction do not think she is a candidate for ablation.  In regards to the spider veins behind both of her knees, we discussed the option of sclerotherapy and she may call to schedule this in the future.   Marty Heck, MD [facility] Office: [phone] Pager: [phone]

## 2019-02-16 ENCOUNTER — Ambulatory Visit (INDEPENDENT_AMBULATORY_CARE_PROVIDER_SITE_OTHER): Payer: No Typology Code available for payment source | Admitting: Family Medicine

## 2019-02-16 ENCOUNTER — Ambulatory Visit: Payer: No Typology Code available for payment source | Admitting: Family Medicine

## 2019-02-16 ENCOUNTER — Encounter: Payer: Self-pay | Admitting: Family Medicine

## 2019-02-16 ENCOUNTER — Other Ambulatory Visit: Payer: Self-pay

## 2019-02-16 ENCOUNTER — Ambulatory Visit: Payer: Self-pay | Admitting: *Deleted

## 2019-02-16 ENCOUNTER — Ambulatory Visit: Payer: Self-pay

## 2019-02-16 VITALS — BP 120/62 | HR 67 | Temp 98.6°F | Wt 182.2 lb

## 2019-02-16 DIAGNOSIS — K5900 Constipation, unspecified: Secondary | ICD-10-CM | POA: Diagnosis not present

## 2019-02-16 DIAGNOSIS — R079 Chest pain, unspecified: Secondary | ICD-10-CM | POA: Diagnosis not present

## 2019-02-16 DIAGNOSIS — K219 Gastro-esophageal reflux disease without esophagitis: Secondary | ICD-10-CM | POA: Diagnosis not present

## 2019-02-16 MED ORDER — OMEPRAZOLE 20 MG PO CPDR: 20.0000 mg | DELAYED_RELEASE_CAPSULE | Freq: Every day | ORAL | 3 refills | Status: DC

## 2019-02-16 MED FILL — OMEPRAZOLE 20 MG CAP: 20 | 90 days supply | Qty: 90 | Fill #0

## 2019-02-16 NOTE — Progress Notes (Signed)
   Subjective:    Patient ID: Sylvia Meyer, female    DOB: [birth year], 48 y.o.   MRN: [id number]  HPI Here for an episode of chest pain that occurred yesterday evening as she was driving home from work. She felt a tightness in the lower chest below the left breast and this tightness then spread up through the chest into the throat area. This did not have a burning quality like heartburn. She did not have any SOB and no sweats. She felt mildly nauseous briefly but did not vomit. This lasted about 15 minutes and then went away. She has felt fine ever since. She does exercise classes at home with no difficulty. She notes that she has been constipated for the past 2 months, and this is very unusual for her. She admits to a lot of stress on her life and she thinks this has been affecting her body. 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 She rarely has heartburn but she does have frequent "indigestion" where she feels like her food takes a long time to digest. She takes a probiotic daily.    Review of Systems  Constitutional: Negative.   Respiratory: Negative.   Cardiovascular: Positive for chest pain. Negative for palpitations and leg swelling.  Gastrointestinal: Positive for constipation. Negative for abdominal distention, abdominal pain, blood in stool, diarrhea, nausea, rectal pain and vomiting.  Neurological: Negative.        Objective:   Physical Exam Constitutional:      Appearance: Normal appearance.  Cardiovascular:     Rate and Rhythm: Normal rate and regular rhythm.     Pulses: Normal pulses.     Heart sounds: Normal heart sounds.     Comments: EKG today has bradycardia (as usual) but is otherwise unremarkable Pulmonary:     Effort: Pulmonary effort is normal.     Breath sounds: Normal breath sounds.  Chest:     Chest wall: No tenderness.  Abdominal:     General: Abdomen is flat. Bowel sounds are normal. There is no distension.     Palpations: Abdomen is soft. There is  no mass.     Tenderness: There is no abdominal tenderness. There is no guarding or rebound.     Hernia: No hernia is present.  Lymphadenopathy:     Cervical: No cervical adenopathy.  Neurological:     Mental Status: She is alert.           Assessment & Plan:  Her chest pain is likely of GI origin, and it does not seem to be cardiac related. She probably had some esophageal spasm related to GERD. She will start taking Omeprazole 20 mg daily. She will try Metamucil for constipation. Recheck prn.  Alysia Penna, MD

## 2019-02-16 NOTE — Telephone Encounter (Signed)
Pt called with complaints of chest pain 02/15/2019 1830; her pain lasted about 15 min; it went from under left breast, to middle of chest, to her neck and jaw; the pt said that she had an episode in 2019; the pt denies pain at this time, andstates that she would like to have an EKG; recommendations made per nurse triage protocol; she verbalized understanding; she sees Dr Volanda Napoleon, [facility]; pt transferred to [facility] for scheduling.  Reason for Disposition . [1] Chest pain lasting < 5 minutes AND [2] NO chest pain or cardiac symptoms (e.g., breathing difficulty, sweating) now  Answer Assessment - Initial Assessment Questions 1. LOCATION: "Where does it hurt?"       Started under left breast 2. RADIATION: "Does the pain go anywhere else?" (e.g., into neck, jaw, arms, back)     Middle of chest to neck to jaw 3. ONSET: "When did the chest pain begin?" (Minutes, hours or days)    02/15/2019 1830 4. PATTERN "Does the pain come and go, or has it been constant since it started?"  "Does it get worse with exertion?"      constant 5. DURATION: "How long does it last" (e.g., seconds, minutes, hours)  15 min 6. SEVERITY: "How bad is the pain?"  (e.g., Scale 1-10; mild, moderate, or severe)    - MILD (1-3): doesn't interfere with normal activities     - MODERATE (4-7): interferes with normal activities or awakens from sleep    - SEVERE (8-10): excruciating pain, unable to do any normal activities      6 out of 10 7. CARDIAC RISK FACTORS: "Do you have any history of heart problems or risk factors for heart disease?" (e.g., angina, prior heart attack; diabetes, high blood pressure, high cholesterol, smoker, or strong family history of heart disease)     no 8. PULMONARY RISK FACTORS: "Do you have any history of lung disease?"  (e.g., blood clots in lung, asthma, emphysema, birth control pills)     no 9. CAUSE: "What do you think is causing the chest pain?"     Not sure ? anxiety 10. OTHER SYMPTOMS: "Do you  have any other symptoms?" (e.g., dizziness, nausea, vomiting, sweating, fever, difficulty breathing, cough)       Nausea (could be anxiety related) 11. PREGNANCY: "Is there any chance you are pregnant?" "When was your last menstrual period?"       No LMP 1 week ago  Protocols used: CHEST PAIN-A-AH

## 2019-02-22 ENCOUNTER — Encounter: Payer: Self-pay | Admitting: *Deleted

## 2019-02-23 NOTE — Telephone Encounter (Signed)
FYI Pt was seen on 02/16/2019 by Dr Sarajane Jews for chest pains

## 2019-02-26 NOTE — Telephone Encounter (Signed)
Pt was seen by Dr Sarajane Jews on 02/16/2019 for chest pains

## 2019-02-27 ENCOUNTER — Encounter: Payer: Self-pay | Admitting: Obstetrics & Gynecology

## 2019-02-27 ENCOUNTER — Ambulatory Visit (INDEPENDENT_AMBULATORY_CARE_PROVIDER_SITE_OTHER): Payer: No Typology Code available for payment source | Admitting: Obstetrics & Gynecology

## 2019-02-27 ENCOUNTER — Other Ambulatory Visit: Payer: Self-pay

## 2019-02-27 VITALS — BP 110/70 | Ht 65.0 in | Wt 180.0 lb

## 2019-02-27 DIAGNOSIS — Z3009 Encounter for other general counseling and advice on contraception: Secondary | ICD-10-CM

## 2019-02-27 DIAGNOSIS — N945 Secondary dysmenorrhea: Secondary | ICD-10-CM | POA: Diagnosis not present

## 2019-02-27 DIAGNOSIS — Z01419 Encounter for gynecological examination (general) (routine) without abnormal findings: Secondary | ICD-10-CM

## 2019-02-27 NOTE — Progress Notes (Signed)
Sylvia Meyer [birth year] [id number]   History:    48 y.o. G4P1A3L1 Single.  Transgender son (daughter making the transition to female at age 56)  RP:  Established patient presenting for annual gyn exam   HPI: Menses regular normal every month, but leg pains just before each period.  No breakthrough bleeding.  No pelvic pain.  Abstinent.  Breasts normal.  Body mass index 29.95.  Health labs with family physician.  Past medical history,surgical history, family history and social history were all reviewed and documented in the EPIC chart.  Gynecologic History Patient's last menstrual period was 02/02/2019. Contraception: abstinence Last Pap: 12/2016. Results were: Negative/HPV HR positive.  HPV 16-18-45 negative Last mammogram: 01/2018. Results were: Negative Bone Density: Never Colonoscopy: Never  Obstetric History OB History  Gravida Para Term Preterm AB Living  4 1     3 1   SAB TAB Ectopic Multiple Live Births               # Outcome Date GA Lbr Len/2nd Weight Sex Delivery Anes PTL Lv  4 AB           3 AB           2 AB           1 Para              ROS: A ROS was performed and pertinent positives and negatives are included in the history.  GENERAL: No fevers or chills. HEENT: No change in vision, no earache, sore throat or sinus congestion. NECK: No pain or stiffness. CARDIOVASCULAR: No chest pain or pressure. No palpitations. PULMONARY: No shortness of breath, cough or wheeze. GASTROINTESTINAL: No abdominal pain, nausea, vomiting or diarrhea, melena or bright red blood per rectum. GENITOURINARY: No urinary frequency, urgency, hesitancy or dysuria. MUSCULOSKELETAL: No joint or muscle pain, no back pain, no recent trauma. DERMATOLOGIC: No rash, no itching, no lesions. ENDOCRINE: No polyuria, polydipsia, no heat or cold intolerance. No recent change in weight. HEMATOLOGICAL: No anemia or easy bruising or bleeding. NEUROLOGIC: No headache, seizures, numbness, tingling or  weakness. PSYCHIATRIC: No depression, no loss of interest in normal activity or change in sleep pattern.     Exam:   BP 110/70   Ht 5\' 5"  (1.651 m)   Wt 180 lb (81.6 kg)   LMP 02/02/2019   BMI 29.95 kg/m   Body mass index is 29.95 kg/m.  General appearance : Well developed well nourished female. No acute distress HEENT: Eyes: no retinal hemorrhage or exudates,  Neck supple, trachea midline, no carotid bruits, no thyroidmegaly Lungs: Clear to auscultation, no rhonchi or wheezes, or rib retractions  Heart: Regular rate and rhythm, no murmurs or gallops Breast:Examined in sitting and supine position were symmetrical in appearance, no palpable masses or tenderness,  no skin retraction, no nipple inversion, no nipple discharge, no skin discoloration, no axillary or supraclavicular lymphadenopathy Abdomen: no palpable masses or tenderness, no rebound or guarding Extremities: no edema or skin discoloration or tenderness  Pelvic: Vulva: Normal             Vagina: No gross lesions or discharge  Cervix: No gross lesions or discharge.  Pap reflex done.  Uterus  AV, normal size, shape and consistency, non-tender and mobile  Adnexa  Without masses or tenderness  Anus: Normal   Assessment/Plan:  48 y.o. female for annual exam   1. Encounter for routine gynecological examination with Papanicolaou smear of cervix Normal gynecologic  exam.  Pap test July 2018 was negative but HPV high-risk was positive.  HPV 16-18-45 were negative.  Pap reflex done.  Breast exam normal.  Screening mammogram August 2019 was negative.  Repeat screening mammogram now.  Body mass index 29.95.  Recommend a lower calorie/carb diet such as [employer].  Aerobic physical activities 5 times a week and weightlifting every 2 days.  Health labs with family physician.  2. Encounter for other general counseling or advice on contraception Abstinent.  3. Secondary dysmenorrhea Painful menses with leg pains.  Recommend  ibuprofen regularly 600 mg every 6 hours.  If not controlling the symptoms, will consider hormonal treatment.  Other orders - Probiotic Product (PROBIOTIC-10 PO); Take by mouth.  Princess Bruins MD, 9:12 AM 02/27/2019

## 2019-02-28 LAB — PAP IG W/ RFLX HPV ASCU

## 2019-03-04 ENCOUNTER — Encounter: Payer: Self-pay | Admitting: Obstetrics & Gynecology

## 2019-03-04 NOTE — Patient Instructions (Signed)
1. Encounter for routine gynecological examination with Papanicolaou smear of cervix Normal gynecologic exam.  Pap test July 2018 was negative but HPV high-risk was positive.  HPV 16-18-45 were negative.  Pap reflex done.  Breast exam normal.  Screening mammogram August 2019 was negative.  Repeat screening mammogram now.  Body mass index 29.95.  Recommend a lower calorie/carb diet such as [employer].  Aerobic physical activities 5 times a week and weightlifting every 2 days.  Health labs with family physician.  2. Encounter for other general counseling or advice on contraception Abstinent.  3. Secondary dysmenorrhea Painful menses with leg pains.  Recommend ibuprofen regularly 600 mg every 6 hours.  If not controlling the symptoms, will consider hormonal treatment.  Other orders - Probiotic Product (PROBIOTIC-10 PO); Take by mouth.  Sylvia Meyer, it was a pleasure seeing you today!  I will inform you of your results as soon as they are available.

## 2019-04-06 ENCOUNTER — Encounter: Payer: Self-pay | Admitting: Family Medicine

## 2019-04-09 NOTE — Telephone Encounter (Signed)
Could you please get more information from pt regarding the need for the referral

## 2019-04-09 NOTE — Telephone Encounter (Signed)
Okay for referral or does pt needs ov?

## 2019-04-13 ENCOUNTER — Other Ambulatory Visit: Payer: Self-pay

## 2019-04-13 DIAGNOSIS — L989 Disorder of the skin and subcutaneous tissue, unspecified: Secondary | ICD-10-CM

## 2019-04-13 NOTE — Telephone Encounter (Signed)
Dermatology Referral has been placed, pt is aware

## 2019-04-16 ENCOUNTER — Inpatient Hospital Stay: Admission: RE | Admit: 2019-04-16 | Payer: Self-pay | Source: Ambulatory Visit

## 2019-04-16 ENCOUNTER — Telehealth (INDEPENDENT_AMBULATORY_CARE_PROVIDER_SITE_OTHER): Payer: No Typology Code available for payment source | Admitting: Family Medicine

## 2019-04-16 ENCOUNTER — Telehealth: Payer: Self-pay | Admitting: Family Medicine

## 2019-04-16 DIAGNOSIS — M25552 Pain in left hip: Secondary | ICD-10-CM

## 2019-04-16 DIAGNOSIS — N946 Dysmenorrhea, unspecified: Secondary | ICD-10-CM

## 2019-04-16 NOTE — Progress Notes (Signed)
Virtual Visit via Video Note  I connected with Rogue Jury on 04/16/19 at  2:30 PM EST by a video enabled telemedicine application 2/2 [id number] pandemic and verified that I am speaking with the correct person using two identifiers.  Location patient: home Location provider:work or home office Persons participating in the virtual visit: patient, provider  I discussed the limitations of evaluation and management by telemedicine and the availability of in person appointments. The patient expressed understanding and agreed to proceed.   HPI: Pt was having a left leg pain x 1 month in the socket of her hip.  Happens right before her period starts.  Pain goes down into leg, L knee, and ankle.  Will wake her up at night.  Improves after her period stops.  Tried OTC lidocaine rubs.  Tylenol and ibuprofen do not help.  Unsure if recent pain worsened by increased activity as pt is moving.  Notes a h/o painful periods that have become worse. Pt is followed by OB/Gyn.  Does not want to be on birth control.     ROS: See pertinent positives and negatives per HPI.  Past Medical History:  Diagnosis Date  . Allergy   . Anemia   . Anxiety   . Depression     No past surgical history on file.  Family History  Problem Relation Age of Onset  . Cancer Mother   . Mental illness Mother   . Hyperlipidemia Mother   . Breast cancer Mother   . Migraines Mother   . Depression Father   . Heart disease Father   . Hyperlipidemia Father   . Hypertension Father   . Stroke Father   . Diabetes Father      Current Outpatient Medications:  .  buPROPion (WELLBUTRIN XL) 150 MG 24 hr tablet, Take 1 tablet (150 mg total) by mouth daily., Disp: 90 tablet, Rfl: 3 .  Cholecalciferol (VITAMIN D3) 2000 units TABS, Take by mouth daily., Disp: , Rfl:  .  fluconazole (DIFLUCAN) 150 MG tablet, , Disp: , Rfl:  .  fluticasone (FLONASE) 50 MCG/ACT nasal spray, Place 2 sprays into both nostrils daily., Disp: 16 g, Rfl: 6 .   Homeopathic Products (AZO YEAST PLUS PO), , Disp: , Rfl:  .  Probiotic Product (PROBIOTIC-10 PO), Take by mouth., Disp: , Rfl:  .  vitamin B-12 (CYANOCOBALAMIN) 100 MCG tablet, Take 100 mcg by mouth daily., Disp: , Rfl:   EXAM:  VITALS per patient if applicable: RR between 123456 bpm  GENERAL: alert, oriented, appears well and in no acute distress  HEENT: atraumatic, conjunctiva clear, no obvious abnormalities on inspection of external nose and ears  NECK: normal movements of the head and neck  LUNGS: on inspection no signs of respiratory distress, breathing rate appears normal, no obvious gross SOB, gasping or wheezing  CV: no obvious cyanosis  MS: moves all visible extremities without noticeable abnormality  PSYCH/NEURO: pleasant and cooperative, no obvious depression or anxiety, speech and thought processing grossly intact  ASSESSMENT AND PLAN:  Discussed the following assessment and plan:  Hip pain, acute, left  -discussed possible causes including arthritis, muscle strain.  Consider endometriosis given h/o painful menses and hip pain worse during menses. -discussed trying OTC voltaren gel, stretching, heat, massage. -will obtain xray. -also advised to f/u with OB/Gyn - Plan: DG Hip Unilat W OR W/O Pelvis 2-3 Views Left  Painful menses -consider endometriosis -advised to discuss further with OB/Gyn  Previous Derm referral sent to an out of  network office.  Will have referral resent to an in network group.  Pt aware.  F/u in 2-4 wks for continued symptoms  I discussed the assessment and treatment plan with the patient. The patient was provided an opportunity to ask questions and all were answered. The patient agreed with the plan and demonstrated an understanding of the instructions.   The patient was advised to call back or seek an in-person evaluation if the symptoms worsen or if the condition fails to improve as anticipated.   Billie Ruddy, MD

## 2019-04-16 NOTE — Telephone Encounter (Signed)
Estill Bamberg calling from [facility] received referral. Not covered under [place]. Please advise with the patient or redirect with who is in network.

## 2019-04-16 NOTE — Telephone Encounter (Signed)
Can this be sent to an in network office?

## 2019-04-17 NOTE — Telephone Encounter (Signed)
Sent to Dr Allyson Sabal - office will contact the pt to schedule directly  Referral place on [address]  Dermatologist in [place], [facility]  [address] [place], [place] [address]   [phone]

## 2019-06-22 MED FILL — BUPROPION HCL XL 150 MG TAB: 150 | 30 days supply | Qty: 30 | Fill #1

## 2019-07-19 DIAGNOSIS — M9901 Segmental and somatic dysfunction of cervical region: Secondary | ICD-10-CM | POA: Diagnosis not present

## 2019-07-19 DIAGNOSIS — M542 Cervicalgia: Secondary | ICD-10-CM | POA: Diagnosis not present

## 2019-07-19 DIAGNOSIS — M546 Pain in thoracic spine: Secondary | ICD-10-CM | POA: Diagnosis not present

## 2019-08-20 MED FILL — buPROPion HCL ER (XL) 150 M: 150 | 30 days supply | Qty: 30 | Fill #2

## 2019-08-29 MED FILL — buPROPion HCL ER (XL) 150 M: 150 | 90 days supply | Qty: 90 | Fill #2

## 2019-09-03 ENCOUNTER — Other Ambulatory Visit: Payer: Self-pay | Admitting: Obstetrics & Gynecology

## 2019-09-03 DIAGNOSIS — Z1231 Encounter for screening mammogram for malignant neoplasm of breast: Secondary | ICD-10-CM

## 2019-09-17 ENCOUNTER — Other Ambulatory Visit: Payer: Self-pay | Admitting: Obstetrics & Gynecology

## 2019-09-17 DIAGNOSIS — Z1231 Encounter for screening mammogram for malignant neoplasm of breast: Secondary | ICD-10-CM

## 2019-09-19 ENCOUNTER — Other Ambulatory Visit: Payer: Self-pay

## 2019-09-19 ENCOUNTER — Ambulatory Visit (INDEPENDENT_AMBULATORY_CARE_PROVIDER_SITE_OTHER): Payer: No Typology Code available for payment source

## 2019-09-19 DIAGNOSIS — Z1231 Encounter for screening mammogram for malignant neoplasm of breast: Secondary | ICD-10-CM | POA: Diagnosis not present

## 2019-12-01 MED FILL — buPROPion HCL ER (XL) 150 M: 150 | 90 days supply | Qty: 90 | Fill #3

## 2020-01-23 ENCOUNTER — Encounter: Payer: No Typology Code available for payment source | Admitting: Family Medicine

## 2020-02-27 ENCOUNTER — Other Ambulatory Visit: Payer: Self-pay

## 2020-02-28 ENCOUNTER — Encounter: Payer: Self-pay | Admitting: Family Medicine

## 2020-02-28 ENCOUNTER — Ambulatory Visit (INDEPENDENT_AMBULATORY_CARE_PROVIDER_SITE_OTHER): Payer: No Typology Code available for payment source | Admitting: Family Medicine

## 2020-02-28 VITALS — BP 98/66 | HR 62 | Temp 98.4°F | Ht 65.0 in | Wt 183.0 lb

## 2020-02-28 DIAGNOSIS — Z131 Encounter for screening for diabetes mellitus: Secondary | ICD-10-CM

## 2020-02-28 DIAGNOSIS — F329 Major depressive disorder, single episode, unspecified: Secondary | ICD-10-CM | POA: Diagnosis not present

## 2020-02-28 DIAGNOSIS — Z0000 Encounter for general adult medical examination without abnormal findings: Secondary | ICD-10-CM

## 2020-02-28 DIAGNOSIS — Z13220 Encounter for screening for lipoid disorders: Secondary | ICD-10-CM | POA: Diagnosis not present

## 2020-02-28 DIAGNOSIS — F419 Anxiety disorder, unspecified: Secondary | ICD-10-CM

## 2020-02-28 DIAGNOSIS — Z1211 Encounter for screening for malignant neoplasm of colon: Secondary | ICD-10-CM

## 2020-02-28 MED ORDER — BUPROPION HCL ER (XL) 150 MG PO TB24: 150.0000 mg | ORAL_TABLET | Freq: Every day | ORAL | 3 refills | Status: DC

## 2020-02-28 NOTE — Patient Instructions (Signed)
Preventive Care 40-50 Years Old, Female Preventive care refers to visits with your health care provider and lifestyle choices that can promote health and wellness. This includes:  A yearly physical exam. This may also be called an annual well check.  Regular dental visits and eye exams.  Immunizations.  Screening for certain conditions.  Healthy lifestyle choices, such as eating a healthy diet, getting regular exercise, not using drugs or products that contain nicotine and tobacco, and limiting alcohol use. What can I expect for my preventive care visit? Physical exam Your health care provider will check your:  Height and weight. This may be used to calculate body mass index (BMI), which tells if you are at a healthy weight.  Heart rate and blood pressure.  Skin for abnormal spots. Counseling Your health care provider may ask you questions about your:  Alcohol, tobacco, and drug use.  Emotional well-being.  Home and relationship well-being.  Sexual activity.  Eating habits.  Work and work environment.  Method of birth control.  Menstrual cycle.  Pregnancy history. What immunizations do I need?  Influenza (flu) vaccine  This is recommended every year. Tetanus, diphtheria, and pertussis (Tdap) vaccine  You may need a Td booster every 10 years. Varicella (chickenpox) vaccine  You may need this if you have not been vaccinated. Zoster (shingles) vaccine  You may need this after age 60. Measles, mumps, and rubella (MMR) vaccine  You may need at least one dose of MMR if you were born in 1957 or later. You may also need a second dose. Pneumococcal conjugate (PCV13) vaccine  You may need this if you have certain conditions and were not previously vaccinated. Pneumococcal polysaccharide (PPSV23) vaccine  You may need one or two doses if you smoke cigarettes or if you have certain conditions. Meningococcal conjugate (MenACWY) vaccine  You may need this if you  have certain conditions. Hepatitis A vaccine  You may need this if you have certain conditions or if you travel or work in places where you may be exposed to hepatitis A. Hepatitis B vaccine  You may need this if you have certain conditions or if you travel or work in places where you may be exposed to hepatitis B. Haemophilus influenzae type b (Hib) vaccine  You may need this if you have certain conditions. Human papillomavirus (HPV) vaccine  If recommended by your health care provider, you may need three doses over 6 months. You may receive vaccines as individual doses or as more than one vaccine together in one shot (combination vaccines). Talk with your health care provider about the risks and benefits of combination vaccines. What tests do I need? Blood tests  Lipid and cholesterol levels. These may be checked every 5 years, or more frequently if you are over 50 years old.  Hepatitis C test.  Hepatitis B test. Screening  Lung cancer screening. You may have this screening every year starting at age 55 if you have a 30-pack-year history of smoking and currently smoke or have quit within the past 15 years.  Colorectal cancer screening. All adults should have this screening starting at age 50 and continuing until age 75. Your health care provider may recommend screening at age 45 if you are at increased risk. You will have tests every 1-10 years, depending on your results and the type of screening test.  Diabetes screening. This is done by checking your blood sugar (glucose) after you have not eaten for a while (fasting). You may have this   done every 1-3 years.  Mammogram. This may be done every 1-2 years. Talk with your health care provider about when you should start having regular mammograms. This may depend on whether you have a family history of breast cancer.  BRCA-related cancer screening. This may be done if you have a family history of breast, ovarian, tubal, or peritoneal  cancers.  Pelvic exam and Pap test. This may be done every 3 years starting at age 36. Starting at age 10, this may be done every 5 years if you have a Pap test in combination with an HPV test. Other tests  Sexually transmitted disease (STD) testing.  Bone density scan. This is done to screen for osteoporosis. You may have this scan if you are at high risk for osteoporosis. Follow these instructions at home: Eating and drinking  Eat a diet that includes fresh fruits and vegetables, whole grains, lean protein, and low-fat dairy.  Take vitamin and mineral supplements as recommended by your health care provider.  Do not drink alcohol if: ? Your health care provider tells you not to drink. ? You are pregnant, may be pregnant, or are planning to become pregnant.  If you drink alcohol: ? Limit how much you have to 0-1 drink a day. ? Be aware of how much alcohol is in your drink. In the [place] one drink equals one 12 oz bottle of beer (355 mL), one 5 oz glass of wine (148 mL), or one 1 oz glass of hard liquor (44 mL). Lifestyle  Take daily care of your teeth and gums.  Stay active. Exercise for at least 30 minutes on 5 or more days each week.  Do not use any products that contain nicotine or tobacco, such as cigarettes, e-cigarettes, and chewing tobacco. If you need help quitting, ask your health care provider.  If you are sexually active, practice safe sex. Use a condom or other form of birth control (contraception) in order to prevent pregnancy and STIs (sexually transmitted infections).  If told by your health care provider, take low-dose aspirin daily starting at age 97. What's next?  Visit your health care provider once a year for a well check visit.  Ask your health care provider how often you should have your eyes and teeth checked.  Stay up to date on all vaccines. This information is not intended to replace advice given to you by your health care provider. Make sure you  discuss any questions you have with your health care provider. Document Revised: 02/09/2018 Document Reviewed: 02/09/2018 Elsevier Patient Education  [place] is the normal time of life before and after menstrual periods stop completely (menopause). Perimenopause can begin 2-8 years before menopause, and it usually lasts for 1 year after menopause. During perimenopause, the ovaries may or may not produce an egg. What are the causes? This condition is caused by a natural change in hormone levels that happens as you get older. What increases the risk? This condition is more likely to start at an earlier age if you have certain medical conditions or treatments, including:  A tumor of the pituitary gland in the brain.  A disease that affects the ovaries and hormone production.  Radiation treatment for cancer.  Certain cancer treatments, such as chemotherapy or hormone (anti-estrogen) therapy.  Heavy smoking and excessive alcohol use.  Family history of early menopause. What are the signs or symptoms? Perimenopausal changes affect each woman differently. Symptoms of this condition may include:  Hot flashes.  Night sweats.  Irregular menstrual periods.  Decreased sex drive.  Vaginal dryness.  Headaches.  Mood swings.  Depression.  Memory problems or trouble concentrating.  Irritability.  Tiredness.  Weight gain.  Anxiety.  Trouble getting pregnant. How is this diagnosed? This condition is diagnosed based on your medical history, a physical exam, your age, your menstrual history, and your symptoms. Hormone tests may also be done. How is this treated? In some cases, no treatment is needed. You and your health care provider should make a decision together about whether treatment is necessary. Treatment will be based on your individual condition and preferences. Various treatments are available, such as:  Menopausal hormone  therapy (MHT).  Medicines to treat specific symptoms.  Acupuncture.  Vitamin or herbal supplements. Before starting treatment, make sure to let your health care provider know if you have a personal or family history of:  Heart disease.  Breast cancer.  Blood clots.  Diabetes.  Osteoporosis. Follow these instructions at home: Lifestyle  Do not use any products that contain nicotine or tobacco, such as cigarettes and e-cigarettes. If you need help quitting, ask your health care provider.  Eat a balanced diet that includes fresh fruits and vegetables, whole grains, soybeans, eggs, lean meat, and low-fat dairy.  Get at least 30 minutes of physical activity on 5 or more days each week.  Avoid alcoholic and caffeinated beverages, as well as spicy foods. This may help prevent hot flashes.  Get 7-8 hours of sleep each night.  Dress in layers that can be removed to help you manage hot flashes.  Find ways to manage stress, such as deep breathing, meditation, or journaling. General instructions  Keep track of your menstrual periods, including: ? When they occur. ? How heavy they are and how long they last. ? How much time passes between periods.  Keep track of your symptoms, noting when they start, how often you have them, and how long they last.  Take over-the-counter and prescription medicines only as told by your health care provider.  Take vitamin supplements only as told by your health care provider. These may include calcium, vitamin E, and vitamin D.  Use vaginal lubricants or moisturizers to help with vaginal dryness and improve comfort during sex.  Talk with your health care provider before starting any herbal supplements.  Keep all follow-up visits as told by your health care provider. This is important. This includes any group therapy or counseling. Contact a health care provider if:  You have heavy vaginal bleeding or pass blood clots.  Your period lasts more  than 2 days longer than normal.  Your periods are recurring sooner than 21 days.  You bleed after having sex. Get help right away if:  You have chest pain, trouble breathing, or trouble talking.  You have severe depression.  You have pain when you urinate.  You have severe headaches.  You have vision problems. Summary  Perimenopause is the time when a woman's body begins to move into menopause. This may happen naturally or as a result of other health problems or medical treatments.  Perimenopause can begin 2-8 years before menopause, and it usually lasts for 1 year after menopause.  Perimenopausal symptoms can be managed through medicines, lifestyle changes, and complementary therapies such as acupuncture. This information is not intended to replace advice given to you by your health care provider. Make sure you discuss any questions you have with your health care provider. Document Revised: 05/13/2017 Document  Reviewed: 07/06/2016 Elsevier Patient Education  [employer].

## 2020-02-28 NOTE — Progress Notes (Signed)
Subjective:     Sylvia Meyer is a 49 y.o. female and is here for a comprehensive physical exam. The patient reports no problems. Pt doing well.  Taking Wellbutrin XL 150 mg for anxiety and depression. Pt inquires about when colon cancer screening is due.  Mammogram done 09/19/19.  Pap done 02/27/19.  LMP 01/25/20.  Social History   Socioeconomic History  . Marital status: Single    Spouse name: Not on file  . Number of children: Not on file  . Years of education: Not on file  . Highest education level: Not on file  Occupational History  . Not on file  Tobacco Use  . Smoking status: Former Smoker    Types: Cigarettes    Quit date: 01/05/2007    Years since quitting: 13.1  . Smokeless tobacco: Never Used  Vaping Use  . Vaping Use: Never used  Substance and Sexual Activity  . Alcohol use: Yes    Alcohol/week: 0.0 standard drinks    Comment: occ  . Drug use: No  . Sexual activity: Not Currently    Partners: Male    Comment: 1st intercourse- 69, partners- more than 10  Other Topics Concern  . Not on file  Social History Narrative  . Not on file   Social Determinants of Health   Financial Resource Strain:   . Difficulty of Paying Living Expenses: Not on file  Food Insecurity:   . Worried About Charity fundraiser in the Last Year: Not on file  . Ran Out of Food in the Last Year: Not on file  Transportation Needs:   . Lack of Transportation (Medical): Not on file  . Lack of Transportation (Non-Medical): Not on file  Physical Activity:   . Days of Exercise per Week: Not on file  . Minutes of Exercise per Session: Not on file  Stress:   . Feeling of Stress : Not on file  Social Connections:   . Frequency of Communication with Friends and Family: Not on file  . Frequency of Social Gatherings with Friends and Family: Not on file  . Attends Religious Services: Not on file  . Active Member of Clubs or Organizations: Not on file  . Attends Archivist Meetings: Not on  file  . Marital Status: Not on file  Intimate Partner Violence:   . Fear of Current or Ex-Partner: Not on file  . Emotionally Abused: Not on file  . Physically Abused: Not on file  . Sexually Abused: Not on file   Health Maintenance  Topic Date Due  . INFLUENZA VACCINE  09/11/2020 (Originally 01/13/2020)  . PAP SMEAR-Modifier  02/26/2022  . TETANUS/TDAP  07/24/2024  . COVID-19 Vaccine  Completed  . Hepatitis C Screening  Completed  . HIV Screening  Completed    The following portions of the patient's history were reviewed and updated as appropriate: allergies, current medications, past family history, past medical history, past social history, past surgical history and problem list.  Review of Systems A comprehensive review of systems was negative.   Objective:    BP 98/66 (BP Location: Left Arm, Patient Position: Sitting, Cuff Size: Large)   Pulse 62   Temp 98.4 F (36.9 C) (Oral)   Ht 5\' 5"  (1.651 m)   Wt 183 lb (83 kg)   LMP 01/25/2020 (Approximate)   SpO2 96%   BMI 30.45 kg/m  General appearance: alert, cooperative and no distress Head: Normocephalic, without obvious abnormality, atraumatic Eyes: conjunctivae/corneas clear.  PERRL, EOM's intact. Fundi benign. Ears: normal TM's and external ear canals both ears Nose: Nares normal. Septum midline. Mucosa normal. No drainage or sinus tenderness. Throat: lips, mucosa, and tongue normal; teeth and gums normal Neck: no adenopathy, no carotid bruit, no JVD, supple, symmetrical, trachea midline and thyroid not enlarged, symmetric, no tenderness/mass/nodules Lungs: clear to auscultation bilaterally Heart: regular rate and rhythm, S1, S2 normal, no murmur, click, rub or gallop Abdomen: soft, non-tender; bowel sounds normal; no masses,  no organomegaly Extremities: extremities normal, atraumatic, no cyanosis or edema Pulses: 2+ and symmetric Skin: Skin color, texture, turgor normal. No rashes or lesions Lymph nodes: Cervical,  supraclavicular, and axillary nodes normal. Neurologic: Alert and oriented X 3, normal strength and tone. Normal symmetric reflexes. Normal coordination and gait    Assessment:    Healthy female exam.      Plan:     Anticipatory guidance given including wearing seatbelts, smoke detectors in the home, increasing physical activity, increasing p.o. intake of water and vegetables. -will obtain labs -pap up to date, done 02/27/2019 with OB/GYN.  Repeat in 5 years -Given handout -Mammogram done 09/19/2019 -Next CPE in 1 year See After Visit Summary for Counseling Recommendations    Anxiety and depression  -Stable -GAD-7 score 4 -PHQ-9 score 1 -Continue Wellbutrin XL 150 mg daily - Plan: TSH, T4, Free, buPROPion (WELLBUTRIN XL) 150 MG 24 hr tablet  Screening for cholesterol level  - Plan: Lipid Panel  Screening for diabetes mellitus  - Plan: Hemoglobin A1c  Colon cancer screening  -To be schedule after birthday in October. - Plan: Ambulatory referral to Gastroenterology  Follow-up as needed  Grier Mitts, MD

## 2020-02-29 LAB — CBC WITH DIFFERENTIAL/PLATELET
Absolute Monocytes: 446 cells/uL (ref 200–950)
Basophils Absolute: 22 cells/uL (ref 0–200)
Basophils Relative: 0.3 %
Eosinophils Absolute: 79 cells/uL (ref 15–500)
Eosinophils Relative: 1.1 %
HCT: 40.4 % (ref 35.0–45.0)
Hemoglobin: 13.3 g/dL (ref 11.7–15.5)
Lymphs Abs: 1937 cells/uL (ref 850–3900)
MCH: 30.7 pg (ref 27.0–33.0)
MCHC: 32.9 g/dL (ref 32.0–36.0)
MCV: 93.3 fL (ref 80.0–100.0)
MPV: 11 fL (ref 7.5–12.5)
Monocytes Relative: 6.2 %
Neutro Abs: 4716 cells/uL (ref 1500–7800)
Neutrophils Relative %: 65.5 %
Platelets: 246 10*3/uL (ref 140–400)
RBC: 4.33 10*6/uL (ref 3.80–5.10)
RDW: 12.7 % (ref 11.0–15.0)
Total Lymphocyte: 26.9 %
WBC: 7.2 10*3/uL (ref 3.8–10.8)

## 2020-02-29 LAB — BASIC METABOLIC PANEL
BUN: 14 mg/dL (ref 7–25)
CO2: 28 mmol/L (ref 20–32)
Calcium: 9.3 mg/dL (ref 8.6–10.2)
Chloride: 106 mmol/L (ref 98–110)
Creat: 0.82 mg/dL (ref 0.50–1.10)
Glucose, Bld: 86 mg/dL (ref 65–99)
Potassium: 4.9 mmol/L (ref 3.5–5.3)
Sodium: 140 mmol/L (ref 135–146)

## 2020-02-29 LAB — HEMOGLOBIN A1C
Hgb A1c MFr Bld: 5.1 % of total Hgb (ref <5.7)
Mean Plasma Glucose: 100 (calc)
eAG (mmol/L): 5.5 (calc)

## 2020-02-29 LAB — T4, FREE: Free T4: 1.2 ng/dL (ref 0.8–1.8)

## 2020-02-29 LAB — LIPID PANEL
Cholesterol: 196 mg/dL (ref <200)
HDL: 60 mg/dL (ref >=50)
LDL Cholesterol (Calc): 121 mg/dL (calc) — ABNORMAL HIGH
Non-HDL Cholesterol (Calc): 136 mg/dL (calc) — ABNORMAL HIGH (ref <130)
Total CHOL/HDL Ratio: 3.3 (calc) (ref <5.0)
Triglycerides: 60 mg/dL (ref <150)

## 2020-02-29 LAB — TSH: TSH: 3.5 mIU/L

## 2020-02-29 LAB — VITAMIN D 25 HYDROXY (VIT D DEFICIENCY, FRACTURES): Vit D, 25-Hydroxy: 37 ng/mL (ref 30–100)

## 2021-05-20 ENCOUNTER — Ambulatory Visit (INDEPENDENT_AMBULATORY_CARE_PROVIDER_SITE_OTHER): Payer: No Typology Code available for payment source | Admitting: Family Medicine

## 2021-05-20 ENCOUNTER — Encounter: Payer: Self-pay | Admitting: Family Medicine

## 2021-05-20 VITALS — BP 102/68 | HR 68 | Temp 98.5°F | Wt 190.8 lb

## 2021-05-20 DIAGNOSIS — N644 Mastodynia: Secondary | ICD-10-CM | POA: Diagnosis not present

## 2021-05-20 NOTE — Progress Notes (Signed)
Subjective:    Patient ID: Sylvia Meyer, female    DOB: 07/09/1970, 50 y.o.   MRN: [id number]  Chief Complaint  Patient presents with   Breast Pain    Noticed it again on Monday, feels a little larger.    HPI Patient was seen today for acute concern.  Patient with left breast tenderness.  Patient states area is typically there but seems slightly larger in the last few days.  Patient notes a history.  Has required ultrasound in the past.  Denies drainage, skin changes, erythema, changes in medications, new supplements, recent vaccinations.  Mentions she is recovering from a cold.  Drinking 1.5-2 cups of coffee per day.  Past Medical History:  Diagnosis Date   Allergy    Anemia    Anxiety    Depression     Allergies  Allergen Reactions   Kelp-B6-Lecithin-Vinegar Other (See Comments)    ROS General: Denies fever, chills, night sweats, changes in weight, changes in appetite HEENT: Denies headaches, ear pain, changes in vision, rhinorrhea, sore throat CV: Denies CP, palpitations, SOB, orthopnea Pulm: Denies SOB, cough, wheezing GI: Denies abdominal pain, nausea, vomiting, diarrhea, constipation GU: Denies dysuria, hematuria, frequency, vaginal discharge + left breast soreness Msk: Denies muscle cramps, joint pains Neuro: Denies weakness, numbness, tingling Skin: Denies rashes, bruising Psych: Denies depression, anxiety, hallucinations      Objective:    Blood pressure 102/68, pulse 68, temperature 98.5 F (36.9 C), temperature source Oral, weight 190 lb 12.8 oz (86.5 kg), SpO2 93 %.  Gen. Pleasant, well-nourished, in no distress, normal affect   HEENT: [place]/AT, face symmetric, conjunctiva clear, no scleral icterus, PERRLA, EOMI, nares patent without drainage Lungs: no accessory muscle use Cardiovascular: RRR, no peripheral edema GU: Normal-appearing pendulous breast.  No skin changes or nipple discharge.  Right axilla full and firm without lymphadenopathy.  Right breast  without mass or tenderness.  Left breast with round mobile cystic like mass in upper right quadrant at 12:00 with tenderness in lateral edge of left breast near axilla.  No TTP in the left axilla Musculoskeletal: No deformities, no cyanosis or clubbing, normal tone Neuro:  A&Ox3, CN II-XII intact, normal gait Skin:  Warm, no lesions/ rash   Wt Readings from Last 3 Encounters:  05/20/21 190 lb 12.8 oz (86.5 kg)  02/28/20 183 lb (83 kg)  02/27/19 180 lb (81.6 kg)    Lab Results  Component Value Date   WBC 7.2 02/28/2020   HGB 13.3 02/28/2020   HCT 40.4 02/28/2020   PLT 246 02/28/2020   GLUCOSE 86 02/28/2020   CHOL 196 02/28/2020   TRIG 60 02/28/2020   HDL 60 02/28/2020   LDLCALC 121 (H) 02/28/2020   ALT 10 08/30/2017   AST 11 08/30/2017   NA 140 02/28/2020   K 4.9 02/28/2020   CL 106 02/28/2020   CREATININE 0.82 02/28/2020   BUN 14 02/28/2020   CO2 28 02/28/2020   TSH 3.50 02/28/2020   HGBA1C 5.1 02/28/2020    Assessment/Plan:  Pain of left breast  -no mass in area of tenderness -last mammogram 09/19/19 negative, 1 yr f/u advised. -Discussed decreasing caffeine intake - Plan: MM Digital Diagnostic Bilat  F/u as needed  Grier Mitts, MD

## 2021-05-22 ENCOUNTER — Other Ambulatory Visit: Payer: Self-pay | Admitting: Family Medicine

## 2021-05-22 DIAGNOSIS — N644 Mastodynia: Secondary | ICD-10-CM

## 2021-05-29 ENCOUNTER — Other Ambulatory Visit: Payer: Self-pay | Admitting: Family Medicine

## 2021-05-29 DIAGNOSIS — F419 Anxiety disorder, unspecified: Secondary | ICD-10-CM

## 2021-07-03 ENCOUNTER — Ambulatory Visit
Admission: RE | Admit: 2021-07-03 | Discharge: 2021-07-03 | Disposition: A | Discharge status: Home / Self Care | Payer: No Typology Code available for payment source | Source: Ambulatory Visit | Attending: Family Medicine | Admitting: Family Medicine

## 2021-07-03 DIAGNOSIS — N644 Mastodynia: Secondary | ICD-10-CM

## 2022-04-21 ENCOUNTER — Other Ambulatory Visit: Payer: Self-pay | Admitting: Family Medicine

## 2022-04-21 DIAGNOSIS — F32A Depression, unspecified: Secondary | ICD-10-CM

## 2022-04-22 ENCOUNTER — Other Ambulatory Visit: Payer: Self-pay | Admitting: Family Medicine

## 2022-04-22 DIAGNOSIS — F32A Depression, unspecified: Secondary | ICD-10-CM

## 2022-06-25 ENCOUNTER — Other Ambulatory Visit: Payer: Self-pay | Admitting: Obstetrics & Gynecology

## 2022-06-25 DIAGNOSIS — Z1231 Encounter for screening mammogram for malignant neoplasm of breast: Secondary | ICD-10-CM

## 2022-07-12 ENCOUNTER — Ambulatory Visit
Admission: RE | Admit: 2022-07-12 | Discharge: 2022-07-12 | Disposition: A | Discharge status: Home / Self Care | Payer: No Typology Code available for payment source | Source: Ambulatory Visit

## 2022-07-12 DIAGNOSIS — Z1231 Encounter for screening mammogram for malignant neoplasm of breast: Secondary | ICD-10-CM

## 2022-08-06 ENCOUNTER — Ambulatory Visit: Payer: No Typology Code available for payment source | Admitting: Family Medicine

## 2022-08-06 ENCOUNTER — Ambulatory Visit (INDEPENDENT_AMBULATORY_CARE_PROVIDER_SITE_OTHER): Payer: No Typology Code available for payment source | Admitting: Radiology

## 2022-08-06 ENCOUNTER — Encounter: Payer: Self-pay | Admitting: Obstetrics & Gynecology

## 2022-08-06 ENCOUNTER — Encounter: Payer: Self-pay | Admitting: Radiology

## 2022-08-06 ENCOUNTER — Encounter: Payer: Self-pay | Admitting: Family Medicine

## 2022-08-06 ENCOUNTER — Other Ambulatory Visit (HOSPITAL_COMMUNITY)
Admission: RE | Admit: 2022-08-06 | Discharge: 2022-08-06 | Disposition: A | Discharge status: Home / Self Care | Payer: No Typology Code available for payment source | Source: Ambulatory Visit | Attending: Radiology | Admitting: Radiology

## 2022-08-06 VITALS — BP 112/64 | Ht 65.0 in | Wt 183.0 lb

## 2022-08-06 VITALS — BP 98/64 | HR 63 | Temp 98.5°F | Ht 64.75 in | Wt 186.4 lb

## 2022-08-06 DIAGNOSIS — M79675 Pain in left toe(s): Secondary | ICD-10-CM | POA: Diagnosis not present

## 2022-08-06 DIAGNOSIS — Z01419 Encounter for gynecological examination (general) (routine) without abnormal findings: Secondary | ICD-10-CM | POA: Diagnosis not present

## 2022-08-06 DIAGNOSIS — N951 Menopausal and female climacteric states: Secondary | ICD-10-CM

## 2022-08-06 DIAGNOSIS — M25552 Pain in left hip: Secondary | ICD-10-CM | POA: Diagnosis not present

## 2022-08-06 DIAGNOSIS — M25551 Pain in right hip: Secondary | ICD-10-CM

## 2022-08-06 DIAGNOSIS — L84 Corns and callosities: Secondary | ICD-10-CM

## 2022-08-06 DIAGNOSIS — Z23 Encounter for immunization: Secondary | ICD-10-CM

## 2022-08-06 DIAGNOSIS — Z0000 Encounter for general adult medical examination without abnormal findings: Secondary | ICD-10-CM | POA: Diagnosis not present

## 2022-08-06 DIAGNOSIS — E785 Hyperlipidemia, unspecified: Secondary | ICD-10-CM

## 2022-08-06 DIAGNOSIS — E7841 Elevated Lipoprotein(a): Secondary | ICD-10-CM | POA: Diagnosis not present

## 2022-08-06 DIAGNOSIS — E559 Vitamin D deficiency, unspecified: Secondary | ICD-10-CM

## 2022-08-06 LAB — CBC WITH DIFFERENTIAL/PLATELET
Basophils Absolute: 0 10*3/uL (ref 0.0–0.1)
Basophils Relative: 0.6 % (ref 0.0–3.0)
Eosinophils Absolute: 0.1 10*3/uL (ref 0.0–0.7)
Eosinophils Relative: 1.2 % (ref 0.0–5.0)
HCT: 41.1 % (ref 36.0–46.0)
Hemoglobin: 13.7 g/dL (ref 12.0–15.0)
Lymphocytes Relative: 35.9 % (ref 12.0–46.0)
Lymphs Abs: 2.9 10*3/uL (ref 0.7–4.0)
MCHC: 33.2 g/dL (ref 30.0–36.0)
MCV: 90.5 fl (ref 78.0–100.0)
Monocytes Absolute: 0.5 10*3/uL (ref 0.1–1.0)
Monocytes Relative: 6.1 % (ref 3.0–12.0)
Neutro Abs: 4.5 10*3/uL (ref 1.4–7.7)
Neutrophils Relative %: 56.2 % (ref 43.0–77.0)
Platelets: 285 10*3/uL (ref 150.0–400.0)
RBC: 4.54 Mil/uL (ref 3.87–5.11)
RDW: 13.5 % (ref 11.5–15.5)
WBC: 8 10*3/uL (ref 4.0–10.5)

## 2022-08-06 LAB — COMPREHENSIVE METABOLIC PANEL
ALT: 14 U/L (ref 0–35)
AST: 16 U/L (ref 0–37)
Albumin: 4.5 g/dL (ref 3.5–5.2)
Alkaline Phosphatase: 79 U/L (ref 39–117)
BUN: 20 mg/dL (ref 6–23)
CO2: 29 mEq/L (ref 19–32)
Calcium: 9.5 mg/dL (ref 8.4–10.5)
Chloride: 102 mEq/L (ref 96–112)
Creatinine, Ser: 0.96 mg/dL (ref 0.40–1.20)
GFR: 68.1 mL/min (ref >60.00)
Glucose, Bld: 86 mg/dL (ref 70–99)
Potassium: 4.1 mEq/L (ref 3.5–5.1)
Sodium: 140 mEq/L (ref 135–145)
Total Bilirubin: 0.2 mg/dL (ref 0.2–1.2)
Total Protein: 7.3 g/dL (ref 6.0–8.3)

## 2022-08-06 LAB — T4, FREE: Free T4: 0.83 ng/dL (ref 0.60–1.60)

## 2022-08-06 LAB — LIPID PANEL
Cholesterol: 266 mg/dL — ABNORMAL HIGH (ref 0–200)
HDL: 55.2 mg/dL (ref >39.00)
LDL Cholesterol: 189 mg/dL — ABNORMAL HIGH (ref 0–99)
NonHDL: 211.14
Total CHOL/HDL Ratio: 5
Triglycerides: 112 mg/dL (ref 0.0–149.0)
VLDL: 22.4 mg/dL (ref 0.0–40.0)

## 2022-08-06 LAB — TSH: TSH: 1.95 u[IU]/mL (ref 0.35–5.50)

## 2022-08-06 LAB — VITAMIN D 25 HYDROXY (VIT D DEFICIENCY, FRACTURES): VITD: 24.91 ng/mL — ABNORMAL LOW (ref 30.00–100.00)

## 2022-08-06 MED ORDER — ESTRADIOL 0.05 MG/24HR TD PTTW: 1.0000 | MEDICATED_PATCH | TRANSDERMAL | 0 refills | Status: DC

## 2022-08-06 MED ORDER — PROGESTERONE MICRONIZED 100 MG PO CAPS: 100.0000 mg | ORAL_CAPSULE | Freq: Every day | ORAL | 0 refills | Status: DC

## 2022-08-06 MED ORDER — PROGESTERONE MICRONIZED 100 MG PO CAPS: 100.0000 mg | ORAL_CAPSULE | Freq: Every evening | ORAL | 0 refills | Status: DC

## 2022-08-06 NOTE — Progress Notes (Signed)
Sylvia Meyer 12/24/1970 [id number]   History: Postmenopausal 52 y.o. presents for annual exam. Complains menopause symptoms, of hot flashes, trouble sleeping, aching joints.    Gynecologic History Postmenopausal Last Pap: 2021. Results were: normal Last mammogram: 07/12/22. Results were: normal Last colonoscopy: 10/14/20 HRT use: never  Obstetric History OB History  Gravida Para Term Preterm AB Living  '4 1     3 1  '$ SAB IAB Ectopic Multiple Live Births               # Outcome Date GA Lbr Len/2nd Weight Sex Delivery Anes PTL Lv  4 AB           3 AB           2 AB           1 Para              The following portions of the patient's history were reviewed and updated as appropriate: allergies, current medications, past family history, past medical history, past social history, past surgical history, and problem list.  Review of Systems Pertinent items noted in HPI and remainder of comprehensive ROS otherwise negative.  Past medical history, past surgical history, family history and social history were all reviewed and documented in the EPIC chart.  Exam:  Vitals:   08/06/22 1016  BP: 112/64  Weight: 183 lb (83 kg)  Height: '5\' 5"'$  (1.651 m)   Body mass index is 30.45 kg/m.  General appearance:  Normal Thyroid:  Symmetrical, normal in size, without palpable masses or nodularity. Respiratory  Auscultation:  Clear without wheezing or rhonchi Cardiovascular  Auscultation:  Regular rate, without rubs, murmurs or gallops  Edema/varicosities:  Not grossly evident Abdominal  Soft,nontender, without masses, guarding or rebound.  Liver/spleen:  No organomegaly noted  Hernia:  None appreciated  Skin  Inspection:  Grossly normal Breasts: Examined lying and sitting.   Right: Without masses, retractions, nipple discharge or axillary adenopathy.   Left: Without masses, retractions, nipple discharge or axillary adenopathy. Genitourinary   Inguinal/mons:  Normal without  inguinal adenopathy  External genitalia:  Normal appearing vulva with no masses, tenderness, or lesions  BUS/Urethra/Skene's glands:  Normal  Vagina:  Normal appearing with normal color and discharge, no lesions. Atrophy: mild   Cervix:  Normal appearing without discharge or lesions  Uterus:  Normal in size, shape and contour.  Midline and mobile, nontender  Adnexa/parametria:     Rt: Normal in size, without masses or tenderness.   Lt: Normal in size, without masses or tenderness.  Anus and perineum: Normal    Patient informed chaperone available to be present for breast and pelvic exam. Patient has requested no chaperone to be present. Patient has been advised what will be completed during breast and pelvic exam.   Assessment/Plan:   1. Well woman exam with routine gynecological exam - Mammo up to date - Colonoscopy up to date - Labs with PCP - Cytology - PAP( [facility])  2. Vasomotor symptoms due to menopause Discussed risks and benefits of HRT, open to trying - estradiol (VIVELLE-DOT) 0.05 MG/24HR patch; Place 1 patch (0.05 mg total) onto the skin 2 (two) times a week.  Dispense: 24 patch; Refill: 0 - progesterone (PROMETRIUM) 100 MG capsule; Take 1 capsule (100 mg total) by mouth daily.  Dispense: 90 capsule; Refill: 0    Discussed SBE, colonoscopy and DEXA screening as directed. Recommend 171mns of exercise weekly, including weight bearing exercise. Encouraged the use  of seatbelts and sunscreen.  Return in 1 year for annual or sooner prn.  Kerry Dory WHNP-BC, 10:42 AM 08/06/2022

## 2022-08-06 NOTE — Progress Notes (Signed)
Established Patient Office Visit   Subjective  Patient ID: Sylvia Meyer, female    DOB: [birth year]  Age: 52 y.o. MRN: [id number]  Chief Complaint  Patient presents with   Annual Exam    Patient is a 52 year old female seen for CPE.  Patient states she is doing well overall.  Stable on current medications.  Mood and energy are good.  Patient endorses pain of toe on left foot 2/2 callus/corn.  Mammogram up-to-date done 07/12/2022.  Had OB/GYN visit today.    Patient Active Problem List   Diagnosis Date Noted   Large breasts 10/26/2017   Chronic venous insufficiency 10/19/2017   Varicose veins of bilateral lower extremities with other complications [id number]   Poor posture 09/14/2017   Nonallopathic lesion of cervical region 09/14/2017   Slipped rib syndrome 09/14/2017   Nonallopathic lesion of rib cage 09/14/2017   Nonallopathic lesion of thoracic region 09/14/2017   Encounter for general adult medical examination with abnormal findings 08/30/2017   Allergic rhinitis 08/30/2017   Adjustment disorder with mixed anxiety and depressed mood 07/20/2015   Social History   Tobacco Use   Smoking status: Former    Types: Cigarettes    Quit date: 01/05/2007    Years since quitting: 15.6   Smokeless tobacco: Never  Vaping Use   Vaping Use: Never used  Substance Use Topics   Alcohol use: Yes    Comment: rare   Drug use: No   Family History  Problem Relation Age of Onset   Cancer Mother    Mental illness Mother    Hyperlipidemia Mother    Breast cancer Mother    Migraines Mother    Depression Father    Heart disease Father    Hyperlipidemia Father    Hypertension Father    Stroke Father    Diabetes Father    No Known Allergies    ROS Negative unless stated above    Objective:     BP 98/64 (BP Location: Left Arm, Patient Position: Sitting, Cuff Size: Large)   Pulse 63   Temp 98.5 F (36.9 C) (Oral)   Ht 5' 4.75" (1.645 m)   Wt 186 lb 6.4 oz (84.6 kg)    LMP 08/06/2021 (Approximate)   SpO2 98%   BMI 31.26 kg/m    Physical Exam Constitutional:      Appearance: Normal appearance.  HENT:     Head: Normocephalic and atraumatic.     Right Ear: Tympanic membrane, ear canal and external ear normal.     Left Ear: Tympanic membrane, ear canal and external ear normal.     Nose: Nose normal.     Mouth/Throat:     Mouth: Mucous membranes are moist.     Pharynx: No oropharyngeal exudate or posterior oropharyngeal erythema.  Eyes:     General: No scleral icterus.    Extraocular Movements: Extraocular movements intact.     Conjunctiva/sclera: Conjunctivae normal.     Pupils: Pupils are equal, round, and reactive to light.  Neck:     Thyroid: No thyromegaly.  Cardiovascular:     Rate and Rhythm: Normal rate and regular rhythm.     Pulses: Normal pulses.     Heart sounds: Normal heart sounds. No murmur heard.    No friction rub.  Pulmonary:     Effort: Pulmonary effort is normal.     Breath sounds: Normal breath sounds. No wheezing, rhonchi or rales.  Abdominal:     General: Bowel sounds are  normal.     Palpations: Abdomen is soft.     Tenderness: There is no abdominal tenderness.  Musculoskeletal:        General: No deformity. Normal range of motion.  Lymphadenopathy:     Cervical: No cervical adenopathy.  Skin:    General: Skin is warm and dry.     Findings: No lesion.     Comments: Hard callus/corn on the left foot.  Neurological:     General: No focal deficit present.     Mental Status: She is alert and oriented to person, place, and time.  Psychiatric:        Mood and Affect: Mood normal.        Thought Content: Thought content normal.      No results found for any visits on 08/06/22.    Assessment & Plan:  Well adult exam Anticipatory guidance given including wearing seatbelts, smoke detectors in the home, increasing physical activity, increasing p.o. intake of water and vegetables. -Labs -Immunizations reviewed.   Discussed shingles vaccine. -Mammogram up-to-date done 07/12/2022 -Colonoscopy up-to-date done 10/14/2020 follow-up per GI. -Followed by OB/GYN for Pap, done today 08/06/2022. -Neck CPE in 1 year -     CBC with Differential/Platelet -     Comprehensive metabolic panel -     Lipid panel -     TSH -     T4, free -     VITAMIN D 25 Hydroxy (Vit-D Deficiency, Fractures)  Need for shingles vaccine -     Varicella-zoster vaccine IM  Hard corn -Discussed supportive care including Epsom salt soaks, wearing shoes with an appointment the toebox/ that do not rub -OTC pads -Consider podiatry  Pain of toe of left foot -Discussed possible causes including arthritis, calluses/wear -Consider podiatry referral -     CBC with Differential/Platelet  Bilateral hip pain -     CBC with Differential/Platelet -     VITAMIN D 25 Hydroxy (Vit-D Deficiency, Fractures)  Elevated lipoprotein(a) -Total cholesterol 196, HDL 60, LDL 121, triglycerides 60 on 02/28/2020 -Lifestyle modifications -     Comprehensive metabolic panel -     Lipid panel -     Lipid panel; Future  Vitamin D deficiency -     VITAMIN D 25 Hydroxy (Vit-D Deficiency, Fractures); Future   Return if symptoms worsen or fail to improve.   Billie Ruddy, MD

## 2022-08-10 LAB — CYTOLOGY - PAP
Comment: NEGATIVE
Diagnosis: UNDETERMINED — AB
High risk HPV: NEGATIVE

## 2022-08-13 ENCOUNTER — Other Ambulatory Visit: Payer: Self-pay | Admitting: Family Medicine

## 2022-08-13 ENCOUNTER — Other Ambulatory Visit (HOSPITAL_COMMUNITY): Payer: Self-pay

## 2022-08-13 DIAGNOSIS — E559 Vitamin D deficiency, unspecified: Secondary | ICD-10-CM

## 2022-08-13 MED ORDER — VITAMIN D (ERGOCALCIFEROL) 1.25 MG (50000 UNIT) PO CAPS
50000.0000 [IU] | ORAL_CAPSULE | ORAL | 0 refills | Status: DC
  Filled 2022-08-13: qty 12, 84d supply, fill #0

## 2022-08-19 ENCOUNTER — Encounter: Payer: Self-pay | Admitting: Family Medicine

## 2022-08-19 DIAGNOSIS — E559 Vitamin D deficiency, unspecified: Secondary | ICD-10-CM

## 2022-08-19 MED ORDER — VITAMIN D (ERGOCALCIFEROL) 1.25 MG (50000 UNIT) PO CAPS: 50000.0000 [IU] | ORAL_CAPSULE | ORAL | 0 refills | Status: DC

## 2022-08-23 ENCOUNTER — Other Ambulatory Visit (HOSPITAL_COMMUNITY): Payer: Self-pay

## 2022-09-08 ENCOUNTER — Encounter: Payer: Self-pay | Admitting: Family Medicine

## 2022-09-23 ENCOUNTER — Other Ambulatory Visit: Payer: Self-pay | Admitting: Family Medicine

## 2022-09-23 DIAGNOSIS — F32A Depression, unspecified: Secondary | ICD-10-CM

## 2022-09-27 ENCOUNTER — Encounter: Payer: Self-pay | Admitting: Family Medicine

## 2022-09-29 NOTE — Telephone Encounter (Signed)
This was sent in on 4/15.

## 2022-09-30 ENCOUNTER — Other Ambulatory Visit: Payer: Self-pay | Admitting: Radiology

## 2022-09-30 DIAGNOSIS — N951 Menopausal and female climacteric states: Secondary | ICD-10-CM

## 2022-09-30 NOTE — Telephone Encounter (Signed)
Medication refill request: estradiole 0.05mg  patch Last AEX:  08-06-22 Next AEX: not scheduled Last MMG (if hormonal medication request): 07-12-22 neg Refill authorized: patient is requesting a 90 refill. Please approve or deny as appropriate

## 2022-11-05 ENCOUNTER — Ambulatory Visit: Payer: No Typology Code available for payment source | Admitting: Radiology

## 2022-11-05 ENCOUNTER — Ambulatory Visit (INDEPENDENT_AMBULATORY_CARE_PROVIDER_SITE_OTHER): Payer: No Typology Code available for payment source | Admitting: Obstetrics & Gynecology

## 2022-11-05 ENCOUNTER — Encounter: Payer: Self-pay | Admitting: Obstetrics & Gynecology

## 2022-11-05 ENCOUNTER — Telehealth: Payer: Self-pay | Admitting: *Deleted

## 2022-11-05 VITALS — BP 110/74 | HR 54

## 2022-11-05 DIAGNOSIS — Z803 Family history of malignant neoplasm of breast: Secondary | ICD-10-CM | POA: Diagnosis not present

## 2022-11-05 DIAGNOSIS — N951 Menopausal and female climacteric states: Secondary | ICD-10-CM | POA: Diagnosis not present

## 2022-11-05 DIAGNOSIS — Z79890 Hormone replacement therapy (postmenopausal): Secondary | ICD-10-CM | POA: Diagnosis not present

## 2022-11-05 MED ORDER — ESTRADIOL 0.05 MG/24HR TD PTTW: 1.0000 | MEDICATED_PATCH | TRANSDERMAL | 3 refills | Status: DC

## 2022-11-05 MED ORDER — PROGESTERONE MICRONIZED 100 MG PO CAPS: 100.0000 mg | ORAL_CAPSULE | Freq: Every evening | ORAL | 3 refills | Status: DC

## 2022-11-05 NOTE — Telephone Encounter (Signed)
Referral placed.   Routing to Sylvia Meyer FYI.   Encounter closed.  

## 2022-11-05 NOTE — Progress Notes (Signed)
    Sylvia Meyer 05-26-1971 [id number]        52 y.o.  G4P0031   RP: F/U Menopausal Syndrome on HRT x 07/2022  HPI: Symptomatic menopause x about a year.  Started on HRT in 07/2022 with Estradiol 0.05 patch twice a week and Progesterone 100 mg caps HS daily.  No BTB.  Menopausal Sxs well controled.  No personal h/o thrombosis/DVT/PE/Stroke.  BP normal today.  Fam h/o Breast Ca in mother at age 60, now deceased.  Patient will do Genetic testing.  Long term decision on HRT will be based on results, for now will continue on same HRT.   OB History  Gravida Para Term Preterm AB Living  4 1     3 1   SAB IAB Ectopic Multiple Live Births               # Outcome Date GA Lbr Len/2nd Weight Sex Delivery Anes PTL Lv  4 AB           3 AB           2 AB           1 Para             Past medical history,surgical history, problem list, medications, allergies, family history and social history were all reviewed and documented in the EPIC chart.   Directed ROS with pertinent positives and negatives documented in the history of present illness/assessment and plan.  Exam:  Vitals:   11/05/22 0912  BP: 110/74  Pulse: (!) 54  SpO2: 98%   General appearance:  Normal  Gynecologic exam: Deferred   Assessment/Plan:  52 y.o. G4P0031   1. Postmenopausal hormone replacement therapy Symptomatic menopause x about a year.  Started on HRT in 07/2022 with Estradiol 0.05 patch twice a week and Progesterone 100 mg caps HS daily.  No BTB.  Menopausal Sxs well controled.  No personal h/o thrombosis/DVT/PE/Stroke.  BP normal today.  Fam h/o Breast Ca in mother at age 53, now deceased.  Patient will do Genetic testing.  Long term decision on HRT will be based on results, for now will continue on same HRT. - progesterone (PROMETRIUM) 100 MG capsule; Take 1 capsule (100 mg total) by mouth at bedtime. - estradiol (VIVELLE-DOT) 0.05 MG/24HR patch; Place 1 patch (0.05 mg total) onto the skin 2 (two) times a  week.  3. Family history of breast cancer in mother  Mother diagnosed with Breast Ca at age 52, now deceased.  No Genetic testing done per patient.  Recommend genetic testing for patient.  Referred to Dentist at [facility].  Decision on HRT per results/level of risk for Breast Cancer.  Sylvia Del MD, 9:32 AM 11/05/2022

## 2022-11-05 NOTE — Telephone Encounter (Signed)
-----   Message from Genia Del, MD sent at 11/05/2022  9:24 AM EDT ----- Regarding: Refer to [facility] counselor Mother with Breast Cancer at age 53, deceased.  Genetic testing.

## 2022-11-22 ENCOUNTER — Other Ambulatory Visit: Payer: No Typology Code available for payment source

## 2022-12-22 ENCOUNTER — Encounter: Payer: Self-pay | Admitting: Genetic Counselor

## 2022-12-22 ENCOUNTER — Inpatient Hospital Stay: Payer: No Typology Code available for payment source | Attending: Genetic Counselor | Admitting: Genetic Counselor

## 2022-12-22 ENCOUNTER — Inpatient Hospital Stay: Payer: No Typology Code available for payment source

## 2022-12-22 ENCOUNTER — Other Ambulatory Visit: Payer: Self-pay | Admitting: Family Medicine

## 2022-12-22 ENCOUNTER — Other Ambulatory Visit: Payer: Self-pay

## 2022-12-22 ENCOUNTER — Other Ambulatory Visit: Payer: Self-pay | Admitting: Genetic Counselor

## 2022-12-22 DIAGNOSIS — Z800 Family history of malignant neoplasm of digestive organs: Secondary | ICD-10-CM | POA: Diagnosis not present

## 2022-12-22 DIAGNOSIS — Z803 Family history of malignant neoplasm of breast: Secondary | ICD-10-CM

## 2022-12-22 DIAGNOSIS — F32A Depression, unspecified: Secondary | ICD-10-CM

## 2022-12-22 DIAGNOSIS — Z8042 Family history of malignant neoplasm of prostate: Secondary | ICD-10-CM

## 2022-12-22 HISTORY — DX: Family history of malignant neoplasm of prostate: Z80.42

## 2022-12-22 LAB — GENETIC SCREENING ORDER

## 2022-12-22 NOTE — Progress Notes (Signed)
REFERRING PROVIDER: Genia Del, MD [address] [place],  [place] [address]  PRIMARY PROVIDER:  Deeann Saint, MD  PRIMARY REASON FOR VISIT:  1. Family history of breast cancer   2. Family history of colon cancer   3. Family history of prostate cancer      HISTORY OF PRESENT ILLNESS:   Sylvia Meyer, a 52 y.o. female, was seen for a [place] cancer genetics consultation at the request of Dr. Seymour Bars due to a {Personal/family:20331} history of {cancer/polyps}.  Sylvia Meyer presents to clinic today to discuss the possibility of a hereditary predisposition to cancer, genetic testing, and to further clarify her future cancer risks, as well as potential cancer risks for family members.   In ***, at the age of ***, Sylvia Meyer was diagnosed with {CA PATHOLOGY:63853} of the {right left (wildcard):15202} {CA FAOZH:08657}. The treatment plan ***.    *** Sylvia Meyer is a 52 y.o. female with no personal history of cancer.    CANCER HISTORY:  Oncology History   No history exists.     RISK FACTORS:  Menarche was at age ***.  First live birth at age ***.  OCP use for approximately {Numbers 1-12 multi-select:20307} years.  Ovaries intact: {Yes/No-Ex:120004}.  Hysterectomy: {Yes/No-Ex:120004}.  Menopausal status: {Menopause:31378}.  HRT use: {Numbers 1-12 multi-select:20307} years. Colonoscopy: {Yes/No-Ex:120004}; {normal/abnormal/not examined:14677}. Mammogram within the last year: {Yes/No-Ex:120004}. Number of breast biopsies: {Numbers 1-12 multi-select:20307}. Up to date with pelvic exams: {Yes/No-Ex:120004}. Any excessive radiation exposure in the past: {Yes/No-Ex:120004}  Past Medical History:  Diagnosis Date   Allergy    Anemia    Anxiety    Depression    Family history of breast cancer    Family history of colon cancer    Family history of colon cancer    Family history of prostate cancer 12/22/2022    No past surgical history on file.  Social History    Socioeconomic History   Marital status: Single    Spouse name: Not on file   Number of children: Not on file   Years of education: Not on file   Highest education level: Not on file  Occupational History   Not on file  Tobacco Use   Smoking status: Former    Types: Cigarettes    Quit date: 01/05/2007    Years since quitting: 15.9   Smokeless tobacco: Never  Vaping Use   Vaping Use: Never used  Substance and Sexual Activity   Alcohol use: Not Currently   Drug use: No   Sexual activity: Yes    Partners: Male    Birth control/protection: Other-see comments    Comment: 1st intercourse- 15, partners- more than 10, partner vasectomy, menarche 10yo  Other Topics Concern   Not on file  Social History Narrative   Not on file   Social Determinants of Health   Financial Resource Strain: Not on file  Food Insecurity: Not on file  Transportation Needs: Not on file  Physical Activity: Not on file  Stress: Not on file  Social Connections: Not on file     FAMILY HISTORY:  We obtained a detailed, 4-generation family history.  Significant diagnoses are listed below: Family History  Problem Relation Age of Onset   Mental illness Mother    Hyperlipidemia Mother    Breast cancer Mother 37   Migraines Mother    Depression Father    Heart disease Father    Hyperlipidemia Father    Hypertension Father  Stroke Father    Diabetes Father    Breast cancer Paternal Aunt    Colon cancer Paternal Grandmother    Prostate cancer Paternal Grandfather     Sylvia Meyer is {aware/unaware} of previous family history of genetic testing for hereditary cancer risks. Patient's maternal ancestors are of *** descent, and paternal ancestors are of *** descent. There {IS NO:12509} reported Ashkenazi Jewish ancestry. There {IS NO:12509} known consanguinity.  GENETIC COUNSELING ASSESSMENT: Sylvia Meyer is a 52 y.o. female with a {Personal/family:20331} history of {cancer/polyps} which is somewhat  suggestive of a {DISEASE} and predisposition to cancer given ***. We, therefore, discussed and recommended the following at today's visit.   DISCUSSION: We discussed that, in general, most cancer is not inherited in families, but instead is sporadic or familial. Sporadic cancers occur by chance and typically happen at older ages (>50 years) as this type of cancer is caused by genetic changes acquired during an individual's lifetime. Some families have more cancers than would be expected by chance; however, the ages or types of cancer are not consistent with a known genetic mutation or known genetic mutations have been ruled out. This type of familial cancer is thought to be due to a combination of multiple genetic, environmental, hormonal, and lifestyle factors. While this combination of factors likely increases the risk of cancer, the exact source of this risk is not currently identifiable or testable.  We discussed that *** - ***% of *** is hereditary, with most cases associated with ***.  There are other genes that can be associated with hereditary *** cancer syndromes.  These include ***.  We discussed that testing is beneficial for several reasons including knowing how to follow individuals after completing their treatment, identifying whether potential treatment options such as PARP inhibitors would be beneficial, and understand if other family members could be at risk for cancer and allow them to undergo genetic testing.   We reviewed the characteristics, features and inheritance patterns of hereditary cancer syndromes. We also discussed genetic testing, including the appropriate family members to test, the process of testing, insurance coverage and turn-around-time for results. We discussed the implications of a negative, positive, carrier and/or variant of uncertain significant result. Sylvia Meyer  was offered a common hereditary cancer panel (47 genes) and an expanded pan-cancer panel (77 genes). [name] was informed of the benefits and limitations of each panel, including that expanded pan-cancer panels contain genes that do not have clear management guidelines at this point in time.  We also discussed that as the number of genes included on a panel increases, the chances of variants of uncertain significance increases. Sylvia Meyer decided to pursue genetic testing for the *** gene panel.   The CancerNext-Expanded gene panel offered by [facility] and includes sequencing and rearrangement analysis for the following 77 genes: AIP, ALK, APC*, ATM*, AXIN2, BAP1, BARD1, BMPR1A, BRCA1*, BRCA2*, BRIP1*, CDC73, CDH1*, CDK4, CDKN1B, CDKN2A, CHEK2*, CTNNA1, DICER1, FH, FLCN, KIF1B, LZTR1, MAX, MEN1, MET, MLH1*, MSH2*, MSH3, MSH6*, MUTYH*, NF1*, NF2, NTHL1, PALB2*, PHOX2B, PMS2*, POT1, PRKAR1A, PTCH1, PTEN*, RAD51C*, RAD51D*, RB1, RET, SDHA, SDHAF2, SDHB, SDHC, SDHD, SMAD4, SMARCA4, SMARCB1, SMARCE1, STK11, SUFU, TMEM127, TP53*, TSC1, TSC2, and VHL (sequencing and deletion/duplication); EGFR, EGLN1, HOXB13, KIT, MITF, PDGFRA, POLD1, and POLE (sequencing only); EPCAM and GREM1 (deletion/duplication only). DNA and RNA analyses performed for * genes.   Based on Sylvia Meyer's {Personal/family:20331} history of cancer, she meets medical criteria for genetic testing. Despite that she meets criteria, she may still have  an out of pocket cost. We discussed that if her out of pocket cost for testing is over $100, the laboratory will call and confirm whether she wants to proceed with testing.  If the out of pocket cost of testing is less than $100 she will be billed by the genetic testing laboratory.   We discussed that some people do not want to undergo genetic testing due to fear of genetic discrimination.  The Genetic Information Nondiscrimination Act (GINA) was signed into federal law in 2008. GINA prohibits health insurers and most employers from discriminating against individuals based on genetic information  (including the results of genetic tests and family history information). According to GINA, health insurance companies cannot consider genetic information to be a preexisting condition, nor can they use it to make decisions regarding coverage or rates. GINA also makes it illegal for most employers to use genetic information in making decisions about hiring, firing, promotion, or terms of employment. It is important to note that GINA does not offer protections for life insurance, disability insurance, or long-term care insurance. GINA does not apply to those in the [employer], those who work for companies with less than 15 employees, and new life insurance or long-term disability insurance policies.  Health status due to a cancer diagnosis is not protected under GINA. More information about GINA can be found by visiting [email or web address].   PLAN: After considering the risks, benefits, and limitations, Sylvia Meyer provided informed consent to pursue genetic testing and the blood sample was sent to {Lab} Laboratories for analysis of the {test}. Results should be available within approximately {TAT TIME} weeks' time, at which point they will be disclosed by telephone to Sylvia Meyer, as will any additional recommendations warranted by these results. Sylvia Meyer will receive a summary of her genetic counseling visit and a copy of her results once available. This information will also be available in Epic.   Lastly, we encouraged Sylvia Meyer to remain in contact with cancer genetics annually so that we can continuously update the family history and inform her of any changes in cancer genetics and testing that may be of benefit for this family.   Sylvia Meyer questions were answered to her satisfaction today. Our contact information was provided should additional questions or concerns arise. Thank you for the referral and allowing [place] to share in the care of your patient.   Shelita Steptoe P. Lowell Guitar, MS, [facility] Licensed, Administrator, sports [email or web address] phone: [phone]  The patient was seen for a total of *** minutes in face-to-face genetic counseling.  *** The patient was seen alone.  ***The patient brought ***. Drs. Meliton Rattan, and/or Vann Crossroads were available for questions, if needed..    _______________________________________________________________________ For Office Staff:  Number of people involved in session: *** Was an Intern/ student involved with case: {YES/NO:63}

## 2022-12-30 ENCOUNTER — Encounter: Payer: Self-pay | Admitting: Genetic Counselor

## 2022-12-30 ENCOUNTER — Ambulatory Visit: Payer: Self-pay | Admitting: Genetic Counselor

## 2022-12-30 ENCOUNTER — Telehealth: Payer: Self-pay | Admitting: Genetic Counselor

## 2022-12-30 DIAGNOSIS — Z1379 Encounter for other screening for genetic and chromosomal anomalies: Secondary | ICD-10-CM

## 2022-12-30 NOTE — Telephone Encounter (Signed)
Revealed negative genetic testing.  Discussed that we do not know why there is cancer in the family. It could be due to a different gene that we are not testing, or maybe our current technology may not be able to pick something up.  It will be important for her to keep in contact with genetics to keep up with whether additional testing may be needed.  

## 2022-12-30 NOTE — Progress Notes (Signed)
HPI:  Sylvia Meyer was previously seen in the [facility] due to a family history of cancer and concerns regarding a hereditary predisposition to cancer. Please refer to our prior cancer genetics clinic note for more information regarding our discussion, assessment and recommendations, at the time. Sylvia Meyer recent genetic test results were disclosed to her, as were recommendations warranted by these results. These results and recommendations are discussed in more detail below.  CANCER HISTORY:  Oncology History   No history exists.    FAMILY HISTORY:  We obtained a detailed, 4-generation family history.  Significant diagnoses are listed below: Family History  Problem Relation Age of Onset   Mental illness Mother    Hyperlipidemia Mother    Breast cancer Mother 69   Migraines Mother    Depression Father    Heart disease Father    Hyperlipidemia Father    Hypertension Father    Stroke Father    Diabetes Father    Breast cancer Paternal Aunt    Colon cancer Paternal Grandmother    Prostate cancer Paternal Grandfather        The patient has a son, born female and is trans gendered who is cancer free.  She has a paternal half brother who is cancer free.  Both parents are deceased.   The patient's mother had breast cancer at 12 and died of ALS at 23.   She has a brother and sister who are cancer free There is no other reported family history of cancer.   The patient's father died of a heart attack at 63. He had three brothers and a sister.  His sister had breast cancer.  His parents both had cancer.  His father had prostate cancer and his mother had colon cancer.   Sylvia Meyer is unaware of previous family history of genetic testing for hereditary cancer risks. There is no reported Ashkenazi Jewish ancestry. There is no known consanguinity  GENETIC TEST RESULTS: Genetic testing reported out on July 17 through the CancerNext-Expanded+RNAinsight cancer panel found  no pathogenic mutations. The CancerNext-Expanded gene panel offered by [facility] and includes sequencing and rearrangement analysis for the following 77 genes: AIP, ALK, APC*, ATM*, AXIN2, BAP1, BARD1, BMPR1A, BRCA1*, BRCA2*, BRIP1*, CDC73, CDH1*, CDK4, CDKN1B, CDKN2A, CHEK2*, CTNNA1, DICER1, FH, FLCN, KIF1B, LZTR1, MAX, MEN1, MET, MLH1*, MSH2*, MSH3, MSH6*, MUTYH*, NF1*, NF2, NTHL1, PALB2*, PHOX2B, PMS2*, POT1, PRKAR1A, PTCH1, PTEN*, RAD51C*, RAD51D*, RB1, RET, SDHA, SDHAF2, SDHB, SDHC, SDHD, SMAD4, SMARCA4, SMARCB1, SMARCE1, STK11, SUFU, TMEM127, TP53*, TSC1, TSC2, and VHL (sequencing and deletion/duplication); EGFR, EGLN1, HOXB13, KIT, MITF, PDGFRA, POLD1, and POLE (sequencing only); EPCAM and GREM1 (deletion/duplication only). DNA and RNA analyses performed for * genes. The test report has been scanned into EPIC and is located under the Molecular Pathology section of the Results Review tab.  A portion of the result report is included below for reference.     We discussed with Sylvia Meyer that because current genetic testing is not perfect, it is possible there may be a gene mutation in one of these genes that current testing cannot detect, but that chance is small.  We also discussed, that there could be another gene that has not yet been discovered, or that we have not yet tested, that is responsible for the cancer diagnoses in the family. It is also possible there is a hereditary cause for the cancer in the family that Sylvia Meyer did not inherit and therefore was not identified in her testing.  Therefore, it is important to remain in touch with cancer genetics in the future so that we can continue to offer Sylvia Meyer the most up to date genetic testing.   Genetic testing did identify a variant of uncertain significance (VUS) was identified in the NTHL1 gene called p.L23P (c.68T>C).  At this time, it is unknown if this variant is associated with increased cancer risk or if this is a normal finding,  but most variants such as this get reclassified to being inconsequential. It should not be used to make medical management decisions. With time, we suspect the lab will determine the significance of this variant, if any. If we do learn more about it, we will try to contact Sylvia Meyer to discuss it further. However, it is important to stay in touch with [place] periodically and keep the address and phone number up to date.  ADDITIONAL GENETIC TESTING: We discussed with Sylvia Meyer that her genetic testing was fairly extensive.  If there are genes identified to increase cancer risk that can be analyzed in the future, we would be happy to discuss and coordinate this testing at that time.    CANCER SCREENING RECOMMENDATIONS: Sylvia Meyer test result is considered negative (normal).  This means that we have not identified a hereditary cause for her family history of cancer at this time. Most cancers happen by chance and this negative test suggests that her cancer may fall into this category.    Possible reasons for Sylvia Meyer negative genetic test include:  1. There may be a gene mutation in one of these genes that current testing methods cannot detect but that chance is small.  2. There could be another gene that has not yet been discovered, or that we have not yet tested, that is responsible for the cancer diagnoses in the family.  3.  There may be no hereditary risk for cancer in the family. The cancers in Sylvia Meyer and/or her family may be sporadic/familial or due to other genetic and environmental factors. 4. It is also possible there is a hereditary cause for the cancer in the family that Sylvia Meyer did not inherit.  Therefore, it is recommended she continue to follow the cancer management and screening guidelines provided by her primary healthcare provider. An individual's cancer risk and medical management are not determined by genetic test results alone. Overall cancer risk assessment incorporates  additional factors, including personal medical history, family history, and any available genetic information that may result in a personalized plan for cancer prevention and surveillance  RECOMMENDATIONS FOR FAMILY MEMBERS:  Individuals in this family might be at some increased risk of developing cancer, over the general population risk, simply due to the family history of cancer.  We recommended women in this family have a yearly mammogram beginning at age 66, or 61 years younger than the earliest onset of cancer, an annual clinical breast exam, and perform monthly breast self-exams. Women in this family should also have a gynecological exam as recommended by their primary provider. All family members should be referred for colonoscopy starting at age 6.  FOLLOW-UP: Lastly, we discussed with Sylvia Meyer that cancer genetics is a rapidly advancing field and it is possible that new genetic tests will be appropriate for her and/or her family members in the future. We encouraged her to remain in contact with cancer genetics on an annual basis so we can update her personal and family histories and let her know of advances in cancer genetics that  may benefit this family.   Our contact number was provided. Sylvia Meyer questions were answered to her satisfaction, and she knows she is welcome to call us at anytime with additional questions or concerns.   Maylon Cos, MS, [facility] Licensed, Certified Genetic Counselor [email or web address]

## 2023-01-31 ENCOUNTER — Ambulatory Visit: Payer: No Typology Code available for payment source

## 2023-01-31 ENCOUNTER — Ambulatory Visit (INDEPENDENT_AMBULATORY_CARE_PROVIDER_SITE_OTHER): Payer: No Typology Code available for payment source | Admitting: Family Medicine

## 2023-01-31 ENCOUNTER — Encounter: Payer: Self-pay | Admitting: Family Medicine

## 2023-01-31 VITALS — BP 108/76 | HR 59 | Temp 99.0°F | Wt 180.8 lb

## 2023-01-31 DIAGNOSIS — M25551 Pain in right hip: Secondary | ICD-10-CM

## 2023-01-31 DIAGNOSIS — M7918 Myalgia, other site: Secondary | ICD-10-CM | POA: Diagnosis not present

## 2023-01-31 DIAGNOSIS — M25552 Pain in left hip: Secondary | ICD-10-CM | POA: Diagnosis not present

## 2023-01-31 MED ORDER — CYCLOBENZAPRINE HCL 5 MG PO TABS: 5.0000 mg | ORAL_TABLET | Freq: Three times a day (TID) | ORAL | 0 refills | Status: DC | PRN

## 2023-01-31 MED ORDER — CYCLOBENZAPRINE HCL 5 MG PO TABS: 2.5000 mg | ORAL_TABLET | Freq: Every evening | ORAL | 0 refills | Status: AC | PRN

## 2023-01-31 NOTE — Progress Notes (Signed)
Established Patient Office Visit   Subjective  Patient ID: Sylvia Meyer, female    DOB: 1970/12/21  Age: 52 y.o. MRN: [id number]  Chief Complaint  Patient presents with   Pain    Bilateral hip pain and pin in right leg. Going on for years but has gotten worse, shoots down leg, may feel like join pain, muscle pain both. Some days it is in upper glute of right side,right hip or right inner groin. Yoga used to help, ice used to help but now causes more pain. iuprofen and tylenol do not help anymore.  Feels as though PT may help or may need xray    Patient is a 52 year old female who presents for ongoing concern.  Patient endorses acute on chronic right hip pain since her 80s.  Patient does not recall initial injury that may have started symptoms back then.  Patient does note having several falls on her butt throughout the years.  Most recent episode started a few weeks ago with bilateral hip pain.  Patient states pain was in right mid glut, right hip causing soreness in groin and thigh.  Almost like a catching sensation at times.  Has now also noticed mild left hip pain.  Patient notes laying around for a few days helps.  Symptoms may start after doing yard work/bending down.  No increase in pain noted with change in weather.  In the past yoga and stretching helps symptoms but they have not been helping recently.  Patient is also tried massage, ice, ibuprofen, and Tylenol.  In the past x-rays were ordered but not completed.  Symptoms have resolved today.    Past Medical History:  Diagnosis Date   Allergy    Anemia    Anxiety    Depression    Family history of breast cancer    Family history of colon cancer    Family history of colon cancer    Family history of prostate cancer 12/22/2022   History reviewed. No pertinent surgical history. Social History   Tobacco Use   Smoking status: Former    Current packs/day: 0.00    Types: Cigarettes    Quit date: 01/05/2007    Years since  quitting: 16.0   Smokeless tobacco: Never  Vaping Use   Vaping status: Never Used  Substance Use Topics   Alcohol use: Not Currently   Drug use: No   Family History  Problem Relation Age of Onset   Mental illness Mother    Hyperlipidemia Mother    Breast cancer Mother 24   Migraines Mother    Depression Father    Heart disease Father    Hyperlipidemia Father    Hypertension Father    Stroke Father    Diabetes Father    Breast cancer Paternal Aunt    Colon cancer Paternal Grandmother    Prostate cancer Paternal Grandfather    No Known Allergies    ROS Negative unless stated above    Objective:     BP 108/76 (BP Location: Right Arm, Patient Position: Sitting, Cuff Size: Normal)   Pulse (!) 59   Temp 99 F (37.2 C) (Oral)   Wt 180 lb 12.8 oz (82 kg)   SpO2 94%   BMI 30.32 kg/m  BP Readings from Last 3 Encounters:  01/31/23 108/76  11/05/22 110/74  08/06/22 98/64   Wt Readings from Last 3 Encounters:  01/31/23 180 lb 12.8 oz (82 kg)  08/06/22 186 lb 6.4 oz (84.6 kg)  08/06/22 183 lb (83 kg)      Physical Exam Constitutional:      General: She is not in acute distress.    Appearance: Normal appearance.  HENT:     Head: Normocephalic and atraumatic.     Nose: Nose normal.     Mouth/Throat:     Mouth: Mucous membranes are moist.  Cardiovascular:     Rate and Rhythm: Normal rate and regular rhythm.     Heart sounds: Normal heart sounds. No murmur heard.    No gallop.  Pulmonary:     Effort: Pulmonary effort is normal. No respiratory distress.     Breath sounds: Normal breath sounds. No wheezing, rhonchi or rales.  Musculoskeletal:     Comments: No TTP of lumbar spine or paraspinal muscles, lateral hips.  Negative logroll, straight leg raise bilaterally.  Positive FADIR on the right.  No clicks appreciated.  Skin:    General: Skin is warm and dry.  Neurological:     Mental Status: She is alert and oriented to person, place, and time.      No  results found for any visits on 01/31/23.    Assessment & Plan:  Right hip pain -     DG HIP UNILAT W OR W/O PELVIS 2-3 VIEWS RIGHT -     Ambulatory referral to Physical Therapy -     Cyclobenzaprine HCl; Take 0.5 tablets (2.5 mg total) by mouth at bedtime as needed for muscle spasms.  Dispense: 20 tablet; Refill: 0  Gluteal pain -     Cyclobenzaprine HCl; Take 0.5 tablets (2.5 mg total) by mouth at bedtime as needed for muscle spasms.  Dispense: 20 tablet; Refill: 0  Left hip pain  Discussed possible causes of right hip pain including arthritis, bone spurs, labral tear, bursitis, etc.  Left hip likely hurting due to compensating for right hip pain.  Discussed stretching and strengthening muscles of bilateral hips.  Will obtain imaging to evaluate.  PT referral placed.  Muscle relaxer if needed at night due to causing drowsiness in the past.  Further recommendations based on imaging.  Return if symptoms worsen or fail to improve.   Deeann Saint, MD

## 2023-01-31 NOTE — Patient Instructions (Addendum)
An order was placed for physical therapy.  They will call you about setting up an appointment.  A prescription for Flexeril, a muscle relaxer was sent to your pharmacy.  Try taking this at night if needed for muscle tightness/spasms as it can cause drowsiness.  You can even try taking half a tab if needed.

## 2023-02-15 ENCOUNTER — Encounter: Payer: Self-pay | Admitting: Physical Therapy

## 2023-02-15 ENCOUNTER — Other Ambulatory Visit: Payer: Self-pay

## 2023-02-15 ENCOUNTER — Ambulatory Visit: Payer: No Typology Code available for payment source | Attending: Family Medicine | Admitting: Physical Therapy

## 2023-02-15 ENCOUNTER — Other Ambulatory Visit: Payer: No Typology Code available for payment source

## 2023-02-15 DIAGNOSIS — E7841 Elevated Lipoprotein(a): Secondary | ICD-10-CM

## 2023-02-15 DIAGNOSIS — E559 Vitamin D deficiency, unspecified: Secondary | ICD-10-CM

## 2023-02-15 DIAGNOSIS — M6281 Muscle weakness (generalized): Secondary | ICD-10-CM | POA: Diagnosis not present

## 2023-02-15 DIAGNOSIS — M25551 Pain in right hip: Secondary | ICD-10-CM | POA: Insufficient documentation

## 2023-02-15 DIAGNOSIS — R6889 Other general symptoms and signs: Secondary | ICD-10-CM | POA: Insufficient documentation

## 2023-02-15 LAB — LIPID PANEL
Cholesterol: 214 mg/dL — ABNORMAL HIGH (ref 0–200)
HDL: 48.5 mg/dL (ref >39.00)
LDL Cholesterol: 147 mg/dL — ABNORMAL HIGH (ref 0–99)
NonHDL: 165.18
Total CHOL/HDL Ratio: 4
Triglycerides: 91 mg/dL (ref 0.0–149.0)
VLDL: 18.2 mg/dL (ref 0.0–40.0)

## 2023-02-15 NOTE — Therapy (Signed)
OUTPATIENT PHYSICAL THERAPY LOWER EXTREMITY EVALUATION   Patient Name: Sylvia Meyer MRN: [id number] DOB:Nov 23, 1970, 52 y.o., female Today's Date: 02/15/2023  END OF SESSION:  PT End of Session - 02/15/23 1614     Visit Number 1    Number of Visits 12    Date for PT Re-Evaluation 03/29/23    Authorization Type [employer]    PT Start Time 1530    PT Stop Time 1615    PT Time Calculation (min) 45 min    Activity Tolerance Patient tolerated treatment well    Behavior During Therapy [facility] for tasks assessed/performed             Past Medical History:  Diagnosis Date   Allergy    Anemia    Anxiety    Depression    Family history of breast cancer    Family history of colon cancer    Family history of colon cancer    Family history of prostate cancer 12/22/2022   History reviewed. No pertinent surgical history. Patient Active Problem List   Diagnosis Date Noted   Genetic testing 12/30/2022   Family history of breast cancer 12/22/2022   Family history of colon cancer 12/22/2022   Family history of prostate cancer 12/22/2022   Large breasts 10/26/2017   Chronic venous insufficiency 10/19/2017   Varicose veins of bilateral lower extremities with other complications 10/19/2017   Poor posture 09/14/2017   Nonallopathic lesion of cervical region 09/14/2017   Slipped rib syndrome 09/14/2017   Nonallopathic lesion of rib cage 09/14/2017   Nonallopathic lesion of thoracic region 09/14/2017   Encounter for general adult medical examination with abnormal findings 08/30/2017   Allergic rhinitis 08/30/2017   Adjustment disorder with mixed anxiety and depressed mood 07/20/2015    PCP: Abbe Amsterdam  REFERRING PROVIDER: Abbe Amsterdam  REFERRING DIAG: Right hip pain  THERAPY DIAG:  Pain in right hip  Muscle weakness (generalized)  Decreased functional activity tolerance  Rationale for Evaluation and Treatment: Rehabilitation  ONSET DATE: June 2024  SUBJECTIVE:    SUBJECTIVE STATEMENT: Pt states that she has had Rt hip pain and popping since she was 19. She states that currently both hips have pain on the lateral side especially when doing yard work. She states that Rt hip is the worst and hurts from sacrum all the way through the glutes. She states that she used to be able to stretch and that would relieve pain but now her usual stretches don't help.  Pt states increased Rt hip pain when laying on Rt side, after prolonged sitting, first thing in the morning and when doing yard work. She states walking does help pain but she feels her stride is shorter due to "tightness".  Meds do not help, pt states pain is "all the time" but that severity changes  PERTINENT HISTORY: Multiple falls PAIN:  Are you having pain? Yes: NPRS scale: 2-3/10 currently, up to 7/10 at worst/10 Pain location: bilat hips Rt > Lt Pain description: sore, achey Aggravating factors: sitting, laying on Rt side, yard work Relieving factors: none reported  PRECAUTIONS: None  RED FLAGS: None   WEIGHT BEARING RESTRICTIONS: No  FALLS:  Has patient fallen in last 6 months? Yes. 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)  PLOF: Independent  PATIENT GOALS: decrease pain  NEXT MD VISIT: PRN  OBJECTIVE:   DIAGNOSTIC FINDINGS: x ray: OA bilat hips  PATIENT SURVEYS:  FOTO 47  SENSATION: WFL    MUSCLE LENGTH: Hamstrings: Right 108 deg; Left 108 deg Thomas test: negative bilat - Rt hip/sacrum pain with thomas test position  PALPATION: Hip jt mobility WFL lateral glides, inferior glides, anterior glides TTP RT great trochanter, Rt SIJ lateral border Increased mm spasticity Rt glutes  LOWER EXTREMITY ROM:  Active ROM Right eval Left eval  Hip flexion WFL pain WFL  Hip extension    Hip abduction 75% pain 75%  Hip adduction    Hip internal rotation 25% pain 25%  Hip external rotation 75% 75%  Knee flexion    Knee extension     Ankle dorsiflexion    Ankle plantarflexion    Ankle inversion    Ankle eversion     (Blank rows = not tested)  LOWER EXTREMITY MMT:  MMT Right eval Left eval  Hip flexion 4 4  Hip extension 3- 4  Hip abduction 4   Hip adduction    Hip internal rotation    Hip external rotation 4   Knee flexion    Knee extension    Ankle dorsiflexion    Ankle plantarflexion    Ankle inversion    Ankle eversion     (Blank rows = not tested)    TODAY'S TREATMENT:                                                                                                                              [facility] Adult PT Treatment:                                                DATE: 02/15/23 Therapeutic Exercise: See HEP  Modalities: Ice pain Rt hip TENS Rt hip x 10 min to tolerance      PATIENT EDUCATION:  Education details: PT POC and goals, HEP, TENS Person educated: Patient Education method: Explanation, Demonstration, and Handouts Education comprehension: verbalized understanding and returned demonstration  HOME EXERCISE PROGRAM: Access Code: [email or web address] URL: [email or web address] Date: 02/15/2023 Prepared by: Reggy Eye  Exercises - Hip Extension with Single Leg Support Prone on Table Edge  - 1 x daily - 7 x weekly - 3 sets - 10 reps - Isometric Gluteus Medius at Wall  - 1 x daily - 7 x weekly - 2 sets - 10 reps - 3 seconds hold - Sidelying Reverse Clamshell  - 1 x daily - 7 x weekly - 3 sets - 10 reps - Supine Bridge with Mini Swiss Ball Between Knees  - 1 x daily - 7 x weekly - 3 sets - 10 reps  ASSESSMENT:  CLINICAL IMPRESSION: Patient is a 52 y.o. female who was seen today for physical therapy evaluation and treatment for Rt hip pain. Pt presents with decreased ROM and strength, decreased activity tolerance and increased pain. Pt will benefit from  skilled PT to address deficits and improve functional mobility with decreased pain.   OBJECTIVE IMPAIRMENTS: decreased  activity tolerance, decreased ROM, decreased strength, impaired flexibility, and pain.   ACTIVITY LIMITATIONS: sitting and standing  PARTICIPATION LIMITATIONS: driving, community activity, and occupation  PERSONAL FACTORS: Past/current experiences and Time since onset of injury/illness/exacerbation are also affecting patient's functional outcome.   REHAB POTENTIAL: Good  CLINICAL DECISION MAKING: Stable/uncomplicated  EVALUATION COMPLEXITY: Low   GOALS: Goals reviewed with patient? Yes  SHORT TERM GOALS: Target date: 03/01/2023   Pt will be independent with initial HEP Baseline: Goal status: INITIAL  2.  Pt will tolerate 50% Rt hip IR ROM with pain <= 1/10 Baseline:  Goal status: INITIAL   LONG TERM GOALS: Target date: 03/29/2023    Pt will be independent with advanced HEP Baseline:  Goal status: INITIAL  2.  Pt will improve FOTO to >= 64 to demo improved functional mobility Baseline:  Goal status: INITIAL  3.  Pt will tolerate 75% ROM Rt hip IR and abd with pain <= 1/10 Baseline:  Goal status: INITIAL  4.  Pt will report getting up after sitting x 2 hours with pain <= 1/10 Baseline:  Goal status: INITIAL  5.  Pt will improve Rt hip strength to 4+/5 Baseline:  Goal status: INITIAL    PLAN:  PT FREQUENCY: 2x/week  PT DURATION: 6 weeks  PLANNED INTERVENTIONS: Therapeutic exercises, Therapeutic activity, Neuromuscular re-education, Balance training, Gait training, Patient/Family education, Self Care, Joint mobilization, Aquatic Therapy, Dry Needling, Electrical stimulation, Cryotherapy, Moist heat, Taping, Ultrasound, Ionotophoresis 4mg /ml Dexamethasone, Manual therapy, and Re-evaluation  PLAN FOR NEXT SESSION: assess response to HEP, hip and core strengthening, hip mobility as tolerated   Trevyon Swor, PT 02/15/2023, 4:15 PM

## 2023-02-16 LAB — VITAMIN D 25 HYDROXY (VIT D DEFICIENCY, FRACTURES): VITD: 39.4 ng/mL (ref 30.00–100.00)

## 2023-02-24 ENCOUNTER — Ambulatory Visit: Payer: No Typology Code available for payment source

## 2023-02-24 DIAGNOSIS — M25551 Pain in right hip: Secondary | ICD-10-CM

## 2023-02-24 DIAGNOSIS — R6889 Other general symptoms and signs: Secondary | ICD-10-CM

## 2023-02-24 DIAGNOSIS — M6281 Muscle weakness (generalized): Secondary | ICD-10-CM

## 2023-02-24 NOTE — Therapy (Signed)
OUTPATIENT PHYSICAL THERAPY LOWER EXTREMITY TREATMENT   Patient Name: Sylvia Meyer MRN: [id number] DOB:02-09-1971, 52 y.o., female Today's Date: 02/24/2023  END OF SESSION:  PT End of Session - 02/24/23 0800     Visit Number 2    Number of Visits 12    Date for PT Re-Evaluation 03/29/23    Authorization Type [employer]    PT Start Time 0800    PT Stop Time 0850    PT Time Calculation (min) 50 min    Activity Tolerance Patient tolerated treatment well    Behavior During Therapy [facility] for tasks assessed/performed             Past Medical History:  Diagnosis Date   Allergy    Anemia    Anxiety    Depression    Family history of breast cancer    Family history of colon cancer    Family history of colon cancer    Family history of prostate cancer 12/22/2022   History reviewed. No pertinent surgical history. Patient Active Problem List   Diagnosis Date Noted   Genetic testing 12/30/2022   Family history of breast cancer 12/22/2022   Family history of colon cancer 12/22/2022   Family history of prostate cancer 12/22/2022   Large breasts 10/26/2017   Chronic venous insufficiency 10/19/2017   Varicose veins of bilateral lower extremities with other complications 10/19/2017   Poor posture 09/14/2017   Nonallopathic lesion of cervical region 09/14/2017   Slipped rib syndrome 09/14/2017   Nonallopathic lesion of rib cage 09/14/2017   Nonallopathic lesion of thoracic region 09/14/2017   Encounter for general adult medical examination with abnormal findings 08/30/2017   Allergic rhinitis 08/30/2017   Adjustment disorder with mixed anxiety and depressed mood 07/20/2015    PCP: Abbe Amsterdam  REFERRING PROVIDER: Abbe Amsterdam  REFERRING DIAG: Right hip pain  THERAPY DIAG:  Pain in right hip  Muscle weakness (generalized)  Decreased functional activity tolerance  Rationale for Evaluation and Treatment: Rehabilitation  ONSET DATE: June 2024  SUBJECTIVE:    SUBJECTIVE STATEMENT: Patient reports pain along sacrum has gotten better, continues to have tightness in front of hips. Patient states she reviewed HEP and feels two of the exercises are helping, one exercise not so much and the fourth she's not sure if she is doing it right.   PERTINENT HISTORY: Multiple falls PAIN:  Are you having pain? Yes: NPRS scale: 2-3/10 currently, up to 7/10 at worst/10 Pain location: bilat hips Rt > Lt Pain description: sore, achey Aggravating factors: sitting, laying on Rt side, yard work Relieving factors: none reported  PRECAUTIONS: None  RED FLAGS: None   WEIGHT BEARING RESTRICTIONS: No  FALLS:  Has patient fallen in last 6 months? Yes. 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)  PLOF: Independent  PATIENT GOALS: decrease pain  NEXT MD VISIT: PRN  OBJECTIVE:   DIAGNOSTIC FINDINGS: x ray: OA bilat hips  PATIENT SURVEYS:  FOTO 47    SENSATION: WFL  MUSCLE LENGTH: Hamstrings: Right 108 deg; Left 108 deg Thomas test: negative bilat - Rt hip/sacrum pain with thomas test position  PALPATION: Hip jt mobility WFL lateral glides, inferior glides, anterior glides TTP RT great trochanter, Rt SIJ lateral border Increased mm spasticity Rt glutes  LOWER EXTREMITY ROM:  Active ROM Right eval Left eval  Hip flexion WFL pain WFL  Hip extension    Hip abduction 75% pain 75%  Hip adduction  Hip internal rotation 25% pain 25%  Hip external rotation 75% 75%  Knee flexion    Knee extension    Ankle dorsiflexion    Ankle plantarflexion    Ankle inversion    Ankle eversion     (Blank rows = not tested)  LOWER EXTREMITY MMT:  MMT Right eval Left eval  Hip flexion 4 4  Hip extension 3- 4  Hip abduction 4   Hip adduction    Hip internal rotation    Hip external rotation 4   Knee flexion    Knee extension    Ankle dorsiflexion    Ankle plantarflexion    Ankle inversion    Ankle eversion      (Blank rows = not tested)    TODAY'S TREATMENT:                                                                                                                              [facility] Adult PT Treatment:                                                DATE: 02/24/2023 Therapeutic Exercise: Hooklying hip add iso ball squeeze 10x5" Hooklying hip abd iso (gait belt press) 10x5" Bridges + hip abd iso (gait belt) x10 R SLR (small range): parallel, ER, IR 5x5" each S/L reverse clamshell x10 --> clamshell x10 --> alt reverse/regular clamshells x10 Seated R LAQ + hip add iso ball squeeze x10 Wall squat + ball squeeze x10  Wall squat + hip abd iso GTB --> added pulses in squat x10 Bent over hip ext (R) x10 --> on diagonal x10 Primal push-up 5x10" Bent over (R) hip abd in ext YTB x10 Quadruped hydrants YTB x10 (B) Review of updated HEP    [facility] Adult PT Treatment:                                                DATE: 02/15/23 Therapeutic Exercise: See HEP  Modalities: Ice pain Rt hip TENS Rt hip x 10 min to tolerance    PATIENT EDUCATION:  Education details: Updated HEP Person educated: Patient Education method: Explanation, Demonstration, and Handouts Education comprehension: verbalized understanding and returned demonstration  HOME EXERCISE PROGRAM: Access Code: [email or web address] URL: [email or web address] Date: 02/24/2023 Prepared by: Carlynn Herald  Exercises - Hip Extension with Single Leg Support Prone on Table Edge  - 1 x daily - 7 x weekly - 3 sets - 10 reps - Sidelying Reverse Clamshell  - 1 x daily - 7 x weekly - 3 sets - 10 reps - Supine Bridge with Mini Swiss Ball Between Knees  - 1 x daily - 7 x weekly -  3 sets - 10 reps - Small Range Straight Leg Raise  - 1 x daily - 7 x weekly - 3 sets - 10 reps - Primal Push Up  - 1 x daily - 7 x weekly - 3 sets - 10 reps - Quadruped Hip Abduction with Resistance Loop  - 1 x daily - 7 x weekly - 3 sets - 10 reps - Hooklying Isometric  Hip Abduction Adduction with Belt and Ball  - 1 x daily - 7 x weekly - 3 sets - 10 reps - 5 sec hold  ASSESSMENT:  CLINICAL IMPRESSION: Session focused on hip strengthening exercises and progressing HEP as tolerated. Noted fatigue with straight leg raises in internal rotation. Tactile cues improved core stabilization and improved weight bearing in LE during quadruped exercises. HEP updated and edited to progress hip strength and stability.  OBJECTIVE IMPAIRMENTS: decreased activity tolerance, decreased ROM, decreased strength, impaired flexibility, and pain.   ACTIVITY LIMITATIONS: sitting and standing  PARTICIPATION LIMITATIONS: driving, community activity, and occupation  PERSONAL FACTORS: Past/current experiences and Time since onset of injury/illness/exacerbation are also affecting patient's functional outcome.   REHAB POTENTIAL: Good  CLINICAL DECISION MAKING: Stable/uncomplicated  EVALUATION COMPLEXITY: Low   GOALS: Goals reviewed with patient? Yes  SHORT TERM GOALS: Target date: 03/01/2023  Pt will be independent with initial HEP Baseline: Goal status: INITIAL  2.  Pt will tolerate 50% Rt hip IR ROM with pain <= 1/10 Baseline:  Goal status: INITIAL   LONG TERM GOALS: Target date: 03/29/2023  Pt will be independent with advanced HEP Baseline:  Goal status: INITIAL  2.  Pt will improve FOTO to >= 64 to demo improved functional mobility Baseline:  Goal status: INITIAL  3.  Pt will tolerate 75% ROM Rt hip IR and abd with pain <= 1/10 Baseline:  Goal status: INITIAL  4.  Pt will report getting up after sitting x 2 hours with pain <= 1/10 Baseline:  Goal status: INITIAL  5.  Pt will improve Rt hip strength to 4+/5 Baseline:  Goal status: INITIAL    PLAN:  PT FREQUENCY: 2x/week  PT DURATION: 6 weeks  PLANNED INTERVENTIONS: Therapeutic exercises, Therapeutic activity, Neuromuscular re-education, Balance training, Gait training, Patient/Family education,  Self Care, Joint mobilization, Aquatic Therapy, Dry Needling, Electrical stimulation, Cryotherapy, Moist heat, Taping, Ultrasound, Ionotophoresis 4mg /ml Dexamethasone, Manual therapy, and Re-evaluation  PLAN FOR NEXT SESSION: Progress hip and core strengthening, hip & core strengthening as tolerated   Sanjuana Mae, PTA 02/24/2023, 8:50 AM

## 2023-02-28 ENCOUNTER — Ambulatory Visit: Payer: No Typology Code available for payment source

## 2023-02-28 DIAGNOSIS — R6889 Other general symptoms and signs: Secondary | ICD-10-CM

## 2023-02-28 DIAGNOSIS — M25551 Pain in right hip: Secondary | ICD-10-CM | POA: Diagnosis not present

## 2023-02-28 DIAGNOSIS — M6281 Muscle weakness (generalized): Secondary | ICD-10-CM

## 2023-02-28 NOTE — Therapy (Signed)
OUTPATIENT PHYSICAL THERAPY LOWER EXTREMITY TREATMENT   Patient Name: Sylvia Meyer MRN: [id number] DOB:09/15/1970, 52 y.o., female Today's Date: 02/28/2023  END OF SESSION:  PT End of Session - 02/28/23 0804     Visit Number 3    Number of Visits 12    Authorization Type [employer]et[employer]    Authorization Time Period 120 VISITS PER YEAR COMBINED WITH OT    Authorization - Visit Number 3    PT Start Time 0804    PT Stop Time 0855    PT Time Calculation (min) 51 min    Activity Tolerance Patient tolerated treatment well    Behavior During Therapy WFL for tasks assessed/performed             Past Medical History:  Diagnosis Date   Allergy    Anemia    Anxiety    Depression    Family history of breast cancer    Family history of colon cancer    Family history of colon cancer    Family history of prostate cancer 12/22/2022   History reviewed. No pertinent surgical history. Patient Active Problem List   Diagnosis Date Noted   Genetic testing 12/30/2022   Family history of breast cancer 12/22/2022   Family history of colon cancer 12/22/2022   Family history of prostate cancer 12/22/2022   Large breasts 10/26/2017   Chronic venous insufficiency 10/19/2017   Varicose veins of bilateral lower extremities with other complications 10/19/2017   Poor posture 09/14/2017   Nonallopathic lesion of cervical region 09/14/2017   Slipped rib syndrome 09/14/2017   Nonallopathic lesion of rib cage 09/14/2017   Nonallopathic lesion of thoracic region 09/14/2017   Encounter for general adult medical examination with abnormal findings 08/30/2017   Allergic rhinitis 08/30/2017   Adjustment disorder with mixed anxiety and depressed mood 07/20/2015    PCP: Abbe Amsterdam  REFERRING PROVIDER: Abbe Amsterdam  REFERRING DIAG: Right hip pain  THERAPY DIAG:  Pain in right hip  Muscle weakness (generalized)  Decreased functional activity tolerance  Rationale for Evaluation and Treatment:  Rehabilitation  ONSET DATE: June 2024  SUBJECTIVE:   SUBJECTIVE STATEMENT: Patient reports she had pain across sacrum on R side after last visit that lasted a few days. Patient states she did some stretches and felt very sore afterwards, states her R hip starts to feel life it is "catching" and lasts a few days before resolving on its own. Patient states she used ice for back pain.   PERTINENT HISTORY: Multiple falls PAIN:  Are you having pain? Yes: NPRS scale: 2-3/10 currently, up to 7/10 at worst/10 Pain location: bilat hips Rt > Lt Pain description: sore, achey Aggravating factors: sitting, laying on Rt side, yard work Relieving factors: none reported  PRECAUTIONS: None  RED FLAGS: None   WEIGHT BEARING RESTRICTIONS: No  FALLS:  Has patient fallen in last 6 months? Yes. 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)  PLOF: Independent  PATIENT GOALS: decrease pain  NEXT MD VISIT: PRN  OBJECTIVE:   DIAGNOSTIC FINDINGS: x ray: OA bilat hips  PATIENT SURVEYS:  FOTO 47    SENSATION: WFL  MUSCLE LENGTH: Hamstrings: Right 108 deg; Left 108 deg Thomas test: negative bilat - Rt hip/sacrum pain with thomas test position  PALPATION: Hip jt mobility WFL lateral glides, inferior glides, anterior glides TTP RT great trochanter, Rt SIJ lateral border Increased mm spasticity Rt glutes  LOWER EXTREMITY ROM:  Active ROM Right eval  Left eval  Hip flexion WFL pain WFL  Hip extension    Hip abduction 75% pain 75%  Hip adduction    Hip internal rotation 25% pain 25%  Hip external rotation 75% 75%  Knee flexion    Knee extension    Ankle dorsiflexion    Ankle plantarflexion    Ankle inversion    Ankle eversion     (Blank rows = not tested)  LOWER EXTREMITY MMT:  MMT Right eval Left eval  Hip flexion 4 4  Hip extension 3- 4  Hip abduction 4   Hip adduction    Hip internal rotation    Hip external rotation 4   Knee flexion     Knee extension    Ankle dorsiflexion    Ankle plantarflexion    Ankle inversion    Ankle eversion     (Blank rows = not tested)    TODAY'S TREATMENT:                                                                                                                              [facility] Adult PT Treatment:                                                DATE: 02/28/2023 Therapeutic Exercise: NuStep L6 x Side Lying: Alt clamshell/reverse clamshells (RTB at knees, YTB at ankles) Bent knee hip abd (RTB at knees, YTB at ankles) x5, x10 Prone:  Straight leg hip ext x10 (B) Bent knee heel squeeze (frog) 5x5" Bent knee hover hip ext  SIJ MET (break the stick) Standing PPT against wall Neuromuscular Re-education: STS working on hip hinge mechanics and decreasing anterior pelvic tilt Walking with awareness of pelvic/ribcage awareness Modalities: Ice pack low back x 10 min   [facility] Adult PT Treatment:                                                DATE: 02/24/2023 Therapeutic Exercise: Hooklying hip add iso ball squeeze 10x5" Hooklying hip abd iso (gait belt press) 10x5" Bridges + hip abd iso (gait belt) x10 R SLR (small range): parallel, ER, IR 5x5" each S/L reverse clamshell x10 --> clamshell x10 --> alt reverse/regular clamshells x10 Seated R LAQ + hip add iso ball squeeze x10 Wall squat + ball squeeze x10  Wall squat + hip abd iso GTB --> added pulses in squat x10 Bent over hip ext (R) x10 --> on diagonal x10 Primal push-up 5x10" Bent over (R) hip abd in ext YTB x10 Quadruped hydrants YTB x10 (B) Review of updated HEP    PATIENT EDUCATION:  Education details: Updated HEP Person educated: Patient Education method: Explanation,  Demonstration, and Handouts Education comprehension: verbalized understanding and returned demonstration  HOME EXERCISE PROGRAM: Access Code: [email or web address] URL: [email or web address] Date: 02/24/2023 Prepared by: Carlynn Herald  Exercises - Hip Extension with Single Leg Support Prone on Table Edge  - 1 x daily - 7 x weekly - 3 sets - 10 reps - Sidelying Reverse Clamshell  - 1 x daily - 7 x weekly - 3 sets - 10 reps - Supine Bridge with Mini Swiss Ball Between Knees  - 1 x daily - 7 x weekly - 3 sets - 10 reps - Small Range Straight Leg Raise  - 1 x daily - 7 x weekly - 3 sets - 10 reps - Primal Push Up  - 1 x daily - 7 x weekly - 3 sets - 10 reps - Quadruped Hip Abduction with Resistance Loop  - 1 x daily - 7 x weekly - 3 sets - 10 reps - Hooklying Isometric Hip Abduction Adduction with Belt and Ball  - 1 x daily - 7 x weekly - 3 sets - 10 reps - 5 sec hold  ASSESSMENT:  CLINICAL IMPRESSION: Noted anterior pelvic tilt alignment, most prominent during sit to stand transfers; tactile cues added to improve patient's awareness of pelvic and ribcage alignment. Patient continues to have increased pain radiation from R side of sacrum to front of hip.  OBJECTIVE IMPAIRMENTS: decreased activity tolerance, decreased ROM, decreased strength, impaired flexibility, and pain.   ACTIVITY LIMITATIONS: sitting and standing  PARTICIPATION LIMITATIONS: driving, community activity, and occupation  PERSONAL FACTORS: Past/current experiences and Time since onset of injury/illness/exacerbation are also affecting patient's functional outcome.   REHAB POTENTIAL: Good  CLINICAL DECISION MAKING: Stable/uncomplicated  EVALUATION COMPLEXITY: Low   GOALS: Goals reviewed with patient? Yes  SHORT TERM GOALS: Target date: 03/01/2023  Pt will be independent with initial HEP Baseline: Goal status: MET  2.  Pt will tolerate 50% Rt hip IR ROM with pain <= 1/10 Baseline:  Goal status: IN PROGRESS   LONG TERM GOALS: Target date: 03/29/2023  Pt will be independent with advanced HEP Baseline:  Goal status: INITIAL  2.  Pt will improve FOTO to >= 64 to demo improved functional mobility Baseline:  Goal status: INITIAL  3.   Pt will tolerate 75% ROM Rt hip IR and abd with pain <= 1/10 Baseline:  Goal status: INITIAL  4.  Pt will report getting up after sitting x 2 hours with pain <= 1/10 Baseline:  Goal status: INITIAL  5.  Pt will improve Rt hip strength to 4+/5 Baseline:  Goal status: INITIAL    PLAN:  PT FREQUENCY: 2x/week  PT DURATION: 6 weeks  PLANNED INTERVENTIONS: Therapeutic exercises, Therapeutic activity, Neuromuscular re-education, Balance training, Gait training, Patient/Family education, Self Care, Joint mobilization, Aquatic Therapy, Dry Needling, Electrical stimulation, Cryotherapy, Moist heat, Taping, Ultrasound, Ionotophoresis 4mg /ml Dexamethasone, Manual therapy, and Re-evaluation  PLAN FOR NEXT SESSION: Progress hip and core strengthening, hip & core strengthening as tolerated   Sanjuana Mae, PTA 02/28/2023, 9:07 AM

## 2023-03-02 ENCOUNTER — Ambulatory Visit: Payer: No Typology Code available for payment source

## 2023-03-07 ENCOUNTER — Ambulatory Visit: Payer: No Typology Code available for payment source

## 2023-03-07 DIAGNOSIS — R6889 Other general symptoms and signs: Secondary | ICD-10-CM

## 2023-03-07 DIAGNOSIS — M25551 Pain in right hip: Secondary | ICD-10-CM | POA: Diagnosis not present

## 2023-03-07 DIAGNOSIS — M6281 Muscle weakness (generalized): Secondary | ICD-10-CM

## 2023-03-07 NOTE — Therapy (Signed)
OUTPATIENT PHYSICAL THERAPY LOWER EXTREMITY TREATMENT   Patient Name: Sylvia Meyer MRN: [id number] DOB:01/08/1971, 52 y.o., female Today's Date: 03/07/2023  END OF SESSION:  PT End of Session - 03/07/23 0805     Visit Number 4    Number of Visits 12    Date for PT Re-Evaluation 03/29/23    Authorization Type [employer]    Authorization Time Period 120 VISITS PER YEAR COMBINED WITH OT    Authorization - Visit Number 4    PT Start Time 0805    PT Stop Time 0845    PT Time Calculation (min) 40 min    Activity Tolerance Patient tolerated treatment well    Behavior During Therapy WFL for tasks assessed/performed             Past Medical History:  Diagnosis Date   Allergy    Anemia    Anxiety    Depression    Family history of breast cancer    Family history of colon cancer    Family history of colon cancer    Family history of prostate cancer 12/22/2022   History reviewed. No pertinent surgical history. Patient Active Problem List   Diagnosis Date Noted   Genetic testing 12/30/2022   Family history of breast cancer 12/22/2022   Family history of colon cancer 12/22/2022   Family history of prostate cancer 12/22/2022   Large breasts 10/26/2017   Chronic venous insufficiency 10/19/2017   Varicose veins of bilateral lower extremities with other complications 10/19/2017   Poor posture 09/14/2017   Nonallopathic lesion of cervical region 09/14/2017   Slipped rib syndrome 09/14/2017   Nonallopathic lesion of rib cage 09/14/2017   Nonallopathic lesion of thoracic region 09/14/2017   Encounter for general adult medical examination with abnormal findings 08/30/2017   Allergic rhinitis 08/30/2017   Adjustment disorder with mixed anxiety and depressed mood 07/20/2015    PCP: Abbe Amsterdam  REFERRING PROVIDER: Abbe Amsterdam  REFERRING DIAG: Right hip pain  THERAPY DIAG:  Pain in right hip  Muscle weakness (generalized)  Decreased functional activity  tolerance  Rationale for Evaluation and Treatment: Rehabilitation  ONSET DATE: June 2024  SUBJECTIVE:   SUBJECTIVE STATEMENT: Patient reports she is having less pain, more days of no pain but continues to have 5-6/10 pain occasionally that "has me limping".    PERTINENT HISTORY: Multiple falls PAIN:  Are you having pain? Yes: NPRS scale: 2-3/10 currently, up to 7/10 at worst/10 Pain location: bilat hips Rt > Lt Pain description: sore, achey Aggravating factors: sitting, laying on Rt side, yard work Relieving factors: none reported  PRECAUTIONS: None  RED FLAGS: None   WEIGHT BEARING RESTRICTIONS: No  FALLS:  Has patient fallen in last 6 months? Yes. 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)  PLOF: Independent  PATIENT GOALS: decrease pain  NEXT MD VISIT: PRN  OBJECTIVE:   DIAGNOSTIC FINDINGS: x ray: OA bilat hips  PATIENT SURVEYS:  FOTO 47    SENSATION: WFL  MUSCLE LENGTH: Hamstrings: Right 108 deg; Left 108 deg Thomas test: negative bilat - Rt hip/sacrum pain with thomas test position  PALPATION: Hip jt mobility WFL lateral glides, inferior glides, anterior glides TTP RT great trochanter, Rt SIJ lateral border Increased mm spasticity Rt glutes  LOWER EXTREMITY ROM:  Active ROM Right eval Left eval  Hip flexion WFL pain WFL  Hip extension    Hip abduction 75% pain 75%  Hip adduction    Hip  internal rotation 25% pain 25%  Hip external rotation 75% 75%  Knee flexion    Knee extension    Ankle dorsiflexion    Ankle plantarflexion    Ankle inversion    Ankle eversion     (Blank rows = not tested)  LOWER EXTREMITY MMT:  MMT Right eval Left eval  Hip flexion 4 4  Hip extension 3- 4  Hip abduction 4   Hip adduction    Hip internal rotation    Hip external rotation 4   Knee flexion    Knee extension    Ankle dorsiflexion    Ankle plantarflexion    Ankle inversion    Ankle eversion     (Blank rows =  not tested)    TODAY'S TREATMENT:                                                                                                                              [facility] Adult PT Treatment:                                                DATE: 03/07/2023 Therapeutic Exercise: NuStep L5 x S/L clamshells RTB to fatigue (25 reps) S/L reverse clamshell YTB to fatigue ( Supine glute bridge blue TB above knees 2x15 Single leg bridge x10 (B) Primal push-up 3x20" Modified side plank 3x10" (B) STS with focus decreasing APT SLS lateral & back lunges with slider x10 each (B) Divers to chair (R) x10   [facility] Adult PT Treatment:                                                DATE: 02/28/2023 Therapeutic Exercise: NuStep L6 x Side Lying: Alt clamshell/reverse clamshells (RTB at knees, YTB at ankles) Bent knee hip abd (RTB at knees, YTB at ankles) x5, x10 Prone:  Straight leg hip ext x10 (B) Bent knee heel squeeze (frog) 5x5" Bent knee hover hip ext  SIJ MET (break the stick) Standing PPT against wall Neuromuscular Re-education: STS working on hip hinge mechanics and decreasing anterior pelvic tilt Walking with awareness of pelvic/ribcage awareness Modalities: Ice pack low back x 10 min    [facility] Adult PT Treatment:                                                DATE: 02/24/2023 Therapeutic Exercise: Hooklying hip add iso ball squeeze 10x5" Hooklying hip abd iso (gait belt press) 10x5" Bridges + hip abd iso (gait belt) x10 R SLR (small range): parallel, ER, IR  5x5" each S/L reverse clamshell x10 --> clamshell x10 --> alt reverse/regular clamshells x10 Seated R LAQ + hip add iso ball squeeze x10 Wall squat + ball squeeze x10  Wall squat + hip abd iso GTB --> added pulses in squat x10 Bent over hip ext (R) x10 --> on diagonal x10 Primal push-up 5x10" Bent over (R) hip abd in ext YTB x10 Quadruped hydrants YTB x10 (B) Review of updated HEP    PATIENT EDUCATION:  Education details:  Updated HEP Person educated: Patient Education method: Explanation, Demonstration, and Handouts Education comprehension: verbalized understanding and returned demonstration  HOME EXERCISE PROGRAM: Access Code: [email or web address] URL: [email or web address] Date: 03/07/2023 Prepared by: Carlynn Herald  Exercises - Primal Push Up  - 1 x daily - 7 x weekly - 3 sets - 10 reps - Bridge with Hip Abduction and Resistance  - 1 x daily - 7 x weekly - 3 sets - 10 reps - Single Leg Bridge  - 1 x daily - 7 x weekly - 3 sets - 10 reps - Clamshell with Resistance  - 1 x daily - 7 x weekly - 3 sets - 10 reps - Sidelying Reverse Clamshell with Resistance  - 1 x daily - 7 x weekly - 3 sets - 10 reps - Side Plank on Knees  - 1 x daily - 7 x weekly - 3 sets - 3-5 reps - 5-10 sec hold - Quadruped Hip Abduction with Resistance Loop  - 1 x daily - 7 x weekly - 3 sets - 10 reps - The Diver  - 1 x daily - 7 x weekly - 3 sets - 10 reps  ASSESSMENT:  CLINICAL IMPRESSION: Resisted hip strengthening continued; patient challenged with plank variations. HEP revised and updated with resisted exercises. Patient able to complete all exercises with no exacerbation of pain.   OBJECTIVE IMPAIRMENTS: decreased activity tolerance, decreased ROM, decreased strength, impaired flexibility, and pain.   ACTIVITY LIMITATIONS: sitting and standing  PARTICIPATION LIMITATIONS: driving, community activity, and occupation  PERSONAL FACTORS: Past/current experiences and Time since onset of injury/illness/exacerbation are also affecting patient's functional outcome.   REHAB POTENTIAL: Good  CLINICAL DECISION MAKING: Stable/uncomplicated  EVALUATION COMPLEXITY: Low   GOALS: Goals reviewed with patient? Yes  SHORT TERM GOALS: Target date: 03/01/2023  Pt will be independent with initial HEP Baseline: Goal status: MET  2.  Pt will tolerate 50% Rt hip IR ROM with pain <= 1/10 Baseline:  Goal status: IN  PROGRESS   LONG TERM GOALS: Target date: 03/29/2023  Pt will be independent with advanced HEP Baseline:  Goal status: INITIAL  2.  Pt will improve FOTO to >= 64 to demo improved functional mobility Baseline:  Goal status: INITIAL  3.  Pt will tolerate 75% ROM Rt hip IR and abd with pain <= 1/10 Baseline:  Goal status: INITIAL  4.  Pt will report getting up after sitting x 2 hours with pain <= 1/10 Baseline:  Goal status: INITIAL  5.  Pt will improve Rt hip strength to 4+/5 Baseline:  Goal status: INITIAL    PLAN:  PT FREQUENCY: 2x/week  PT DURATION: 6 weeks  PLANNED INTERVENTIONS: Therapeutic exercises, Therapeutic activity, Neuromuscular re-education, Balance training, Gait training, Patient/Family education, Self Care, Joint mobilization, Aquatic Therapy, Dry Needling, Electrical stimulation, Cryotherapy, Moist heat, Taping, Ultrasound, Ionotophoresis 4mg /ml Dexamethasone, Manual therapy, and Re-evaluation  PLAN FOR NEXT SESSION: Progress hip and core strengthening, hip & core strengthening as tolerated   Sanjuana Mae, PTA 03/07/2023, 8:45  AM

## 2023-03-09 ENCOUNTER — Ambulatory Visit: Payer: No Typology Code available for payment source

## 2023-03-14 ENCOUNTER — Ambulatory Visit: Payer: No Typology Code available for payment source

## 2023-03-14 DIAGNOSIS — M25551 Pain in right hip: Secondary | ICD-10-CM

## 2023-03-14 DIAGNOSIS — M6281 Muscle weakness (generalized): Secondary | ICD-10-CM

## 2023-03-14 DIAGNOSIS — R6889 Other general symptoms and signs: Secondary | ICD-10-CM

## 2023-03-14 NOTE — Therapy (Signed)
OUTPATIENT PHYSICAL THERAPY LOWER EXTREMITY TREATMENT   Patient Name: Sylvia Meyer MRN: [id number] DOB:Jun 07, 1971, 52 y.o., female Today's Date: 03/14/2023  END OF SESSION:  PT End of Session - 03/14/23 0800     Visit Number 5    Number of Visits 12    Date for PT Re-Evaluation 03/29/23    Authorization Type [employer]    Authorization Time Period 120 VISITS PER YEAR COMBINED WITH OT    PT Start Time 0800    PT Stop Time 0842    PT Time Calculation (min) 42 min    Activity Tolerance Patient tolerated treatment well    Behavior During Therapy WFL for tasks assessed/performed              Past Medical History:  Diagnosis Date   Allergy    Anemia    Anxiety    Depression    Family history of breast cancer    Family history of colon cancer    Family history of colon cancer    Family history of prostate cancer 12/22/2022   History reviewed. No pertinent surgical history. Patient Active Problem List   Diagnosis Date Noted   Genetic testing 12/30/2022   Family history of breast cancer 12/22/2022   Family history of colon cancer 12/22/2022   Family history of prostate cancer 12/22/2022   Large breasts 10/26/2017   Chronic venous insufficiency 10/19/2017   Varicose veins of bilateral lower extremities with other complications 10/19/2017   Poor posture 09/14/2017   Nonallopathic lesion of cervical region 09/14/2017   Slipped rib syndrome 09/14/2017   Nonallopathic lesion of rib cage 09/14/2017   Nonallopathic lesion of thoracic region 09/14/2017   Encounter for general adult medical examination with abnormal findings 08/30/2017   Allergic rhinitis 08/30/2017   Adjustment disorder with mixed anxiety and depressed mood 07/20/2015    PCP: Abbe Amsterdam  REFERRING PROVIDER: Abbe Amsterdam  REFERRING DIAG: Right hip pain  THERAPY DIAG:  Pain in right hip  Muscle weakness (generalized)  Decreased functional activity tolerance  Rationale for Evaluation and  Treatment: Rehabilitation  ONSET DATE: June 2024  SUBJECTIVE:   SUBJECTIVE STATEMENT: Patient reports Tuesday/Wednesday pain was about a 1 in her hips. On Thursday she was super stiff and had difficulty with her exercises. With pelvic tilts it was causing more pain in the hips. At worst pain has been about a 3 going up the stairs or twist wrong.    PERTINENT HISTORY: Multiple falls PAIN:  Are you having pain? Yes: NPRS scale: 0 currently; 3 at worst /10 Pain location: bilat hips Rt > Lt Pain description: sore, achey Aggravating factors: stairs; yard work Relieving factors: none reported  PRECAUTIONS: None  RED FLAGS: None   WEIGHT BEARING RESTRICTIONS: No  FALLS:  Has patient fallen in last 6 months? Yes. 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)  PLOF: Independent  PATIENT GOALS: decrease pain  NEXT MD VISIT: PRN  OBJECTIVE:   DIAGNOSTIC FINDINGS: x ray: OA bilat hips  PATIENT SURVEYS:  FOTO 47    SENSATION: WFL  MUSCLE LENGTH: Hamstrings: Right 108 deg; Left 108 deg Thomas test: negative bilat - Rt hip/sacrum pain with thomas test position  PALPATION: Hip jt mobility WFL lateral glides, inferior glides, anterior glides TTP RT great trochanter, Rt SIJ lateral border Increased mm spasticity Rt glutes  LOWER EXTREMITY ROM:  Active ROM Right eval Left eval 03/14/23  Hip flexion WFL pain WFL   Hip  extension     Hip abduction 75% pain 75%   Hip adduction     Hip internal rotation 25% pain 25% Lt: Full and pain free passive; Rt 25% limited pain passive 4/10 pain (post manual 1/10 pain 50% limited)  Hip external rotation 75% 75% Lt: Full and pain free passive; Rt: 25% limited passive 4/10; (post manual no pain and full range)   Knee flexion     Knee extension     Ankle dorsiflexion     Ankle plantarflexion     Ankle inversion     Ankle eversion      (Blank rows = not tested)  LOWER EXTREMITY MMT:  MMT Right eval  Left eval  Hip flexion 4 4  Hip extension 3- 4  Hip abduction 4   Hip adduction    Hip internal rotation    Hip external rotation 4   Knee flexion    Knee extension    Ankle dorsiflexion    Ankle plantarflexion    Ankle inversion    Ankle eversion     (Blank rows = not tested)    TODAY'S TREATMENT:           [facility] Adult PT Treatment:                                                DATE: 03/14/23 Therapeutic Exercise: NuStep level 5 x 5 minutes UE/LE Sidelying leg taps 2 x 10  Quadruped leg extension 2 x 10  Fire hydrant 2 x 10  Resisted deadlift green band 2 x 10  Squat with UE support 2 x 10  Manual Therapy: Rt hip MWM for IR/ER Rt hip posterior, inferior mobilizations grade II-III Rt LAD 2 x 1 min                                                                                                                       [facility] Adult PT Treatment:                                                DATE: 03/07/2023 Therapeutic Exercise: NuStep L5 x S/L clamshells RTB to fatigue (25 reps) S/L reverse clamshell YTB to fatigue ( Supine glute bridge blue TB above knees 2x15 Single leg bridge x10 (B) Primal push-up 3x20" Modified side plank 3x10" (B) STS with focus decreasing APT/ SLS lateral & back lunges with slider x10 each (B) Divers to chair (R) x10   [facility] Adult PT Treatment:  DATE: 02/28/2023 Therapeutic Exercise: NuStep L6 x Side Lying: Alt clamshell/reverse clamshells (RTB at knees, YTB at ankles) Bent knee hip abd (RTB at knees, YTB at ankles) x5, x10 Prone:  Straight leg hip ext x10 (B) Bent knee heel squeeze (frog) 5x5" Bent knee hover hip ext  SIJ MET (break the stick) Standing PPT against wall Neuromuscular Re-education: STS working on hip hinge mechanics and decreasing anterior pelvic tilt Walking with awareness of pelvic/ribcage awareness Modalities: Ice pack low back x 10 min      PATIENT EDUCATION:   Education details: HEP review Person educated: Patient Education method: Explanation Education comprehension: verbalized understanding  HOME EXERCISE PROGRAM: Access Code: [email or web address] URL: [email or web address] Date: 03/07/2023 Prepared by: Carlynn Herald  Exercises - Primal Push Up  - 1 x daily - 7 x weekly - 3 sets - 10 reps - Bridge with Hip Abduction and Resistance  - 1 x daily - 7 x weekly - 3 sets - 10 reps - Single Leg Bridge  - 1 x daily - 7 x weekly - 3 sets - 10 reps - Clamshell with Resistance  - 1 x daily - 7 x weekly - 3 sets - 10 reps - Sidelying Reverse Clamshell with Resistance  - 1 x daily - 7 x weekly - 3 sets - 10 reps - Side Plank on Knees  - 1 x daily - 7 x weekly - 3 sets - 3-5 reps - 5-10 sec hold - Quadruped Hip Abduction with Resistance Loop  - 1 x daily - 7 x weekly - 3 sets - 10 reps - The Diver  - 1 x daily - 7 x weekly - 3 sets - 10 reps  ASSESSMENT:  CLINICAL IMPRESSION: Patient responds well to Rt hip mobilizations with improvement and reduction in pain with Rt hip IR/ER PROM post manual work (see specifics above). Continued with progression of hip and core strengthening with good tolerance. Mild difficulty maintaining lumbopelvic stabilization with quadruped progression. Good form with deadlift and squats without onset of hip or back pain.    OBJECTIVE IMPAIRMENTS: decreased activity tolerance, decreased ROM, decreased strength, impaired flexibility, and pain.   ACTIVITY LIMITATIONS: sitting and standing  PARTICIPATION LIMITATIONS: driving, community activity, and occupation  PERSONAL FACTORS: Past/current experiences and Time since onset of injury/illness/exacerbation are also affecting patient's functional outcome.   REHAB POTENTIAL: Good  CLINICAL DECISION MAKING: Stable/uncomplicated  EVALUATION COMPLEXITY: Low   GOALS: Goals reviewed with patient? Yes  SHORT TERM GOALS: Target date: 03/01/2023  Pt will be independent with  initial HEP Baseline: Goal status: MET  2.  Pt will tolerate 50% Rt hip IR ROM with pain <= 1/10 Baseline:  Goal status: MET    LONG TERM GOALS: Target date: 03/29/2023  Pt will be independent with advanced HEP Baseline:  Goal status: INITIAL  2.  Pt will improve FOTO to >= 64 to demo improved functional mobility Baseline:  Goal status: INITIAL  3.  Pt will tolerate 75% ROM Rt hip IR and abd with pain <= 1/10 Baseline:  Goal status: INITIAL  4.  Pt will report getting up after sitting x 2 hours with pain <= 1/10 Baseline:  Goal status: INITIAL  5.  Pt will improve Rt hip strength to 4+/5 Baseline:  Goal status: INITIAL    PLAN:  PT FREQUENCY: 2x/week  PT DURATION: 6 weeks  PLANNED INTERVENTIONS: Therapeutic exercises, Therapeutic activity, Neuromuscular re-education, Balance training, Gait training, Patient/Family education, Self Care, Joint mobilization, Aquatic Therapy, Dry Needling,  Electrical stimulation, Cryotherapy, Moist heat, Taping, Ultrasound, Ionotophoresis 4mg /ml Dexamethasone, Manual therapy, and Re-evaluation  PLAN FOR NEXT SESSION: Progress hip and core strengthening, manual rt hip prn. Progress standing strengthening.   Letitia Libra, PT, DPT, ATC 03/14/23 8:43 AM

## 2023-03-16 ENCOUNTER — Ambulatory Visit: Payer: No Typology Code available for payment source | Attending: Family Medicine

## 2023-03-16 DIAGNOSIS — M6281 Muscle weakness (generalized): Secondary | ICD-10-CM | POA: Insufficient documentation

## 2023-03-16 DIAGNOSIS — M25551 Pain in right hip: Secondary | ICD-10-CM | POA: Diagnosis present

## 2023-03-16 DIAGNOSIS — R6889 Other general symptoms and signs: Secondary | ICD-10-CM | POA: Insufficient documentation

## 2023-03-16 NOTE — Therapy (Signed)
OUTPATIENT PHYSICAL THERAPY LOWER EXTREMITY TREATMENT   Patient Name: Sylvia Meyer MRN: [id number] DOB:1970-08-14, 52 y.o., female Today's Date: 03/16/2023  END OF SESSION:  PT End of Session - 03/16/23 0759     Visit Number 6    Number of Visits 12    Date for PT Re-Evaluation 03/29/23    Authorization Type [employer]    Authorization Time Period 120 VISITS PER YEAR COMBINED WITH OT    PT Start Time 0800    PT Stop Time 0845    PT Time Calculation (min) 45 min    Activity Tolerance Patient tolerated treatment well    Behavior During Therapy WFL for tasks assessed/performed              Past Medical History:  Diagnosis Date   Allergy    Anemia    Anxiety    Depression    Family history of breast cancer    Family history of colon cancer    Family history of colon cancer    Family history of prostate cancer 12/22/2022   History reviewed. No pertinent surgical history. Patient Active Problem List   Diagnosis Date Noted   Genetic testing 12/30/2022   Family history of breast cancer 12/22/2022   Family history of colon cancer 12/22/2022   Family history of prostate cancer 12/22/2022   Large breasts 10/26/2017   Chronic venous insufficiency 10/19/2017   Varicose veins of bilateral lower extremities with other complications 10/19/2017   Poor posture 09/14/2017   Nonallopathic lesion of cervical region 09/14/2017   Slipped rib syndrome 09/14/2017   Nonallopathic lesion of rib cage 09/14/2017   Nonallopathic lesion of thoracic region 09/14/2017   Encounter for general adult medical examination with abnormal findings 08/30/2017   Allergic rhinitis 08/30/2017   Adjustment disorder with mixed anxiety and depressed mood 07/20/2015    PCP: Abbe Amsterdam  REFERRING PROVIDER: Abbe Amsterdam  REFERRING DIAG: Right hip pain  THERAPY DIAG:  Pain in right hip  Muscle weakness (generalized)  Decreased functional activity tolerance  Rationale for Evaluation and  Treatment: Rehabilitation  ONSET DATE: June 2024  SUBJECTIVE:   SUBJECTIVE STATEMENT: Patient reports overall her hips are feeling much better, states going up stairs and walking is a lot better. Patient reports she had a pinching pain on R side of low back after last session, states it is a 1/10 pain today.    PERTINENT HISTORY: Multiple falls PAIN:  Are you having pain? Yes: NPRS scale: 0 currently; 3 at worst /10 Pain location: bilat hips Rt > Lt Pain description: sore, achey Aggravating factors: stairs; yard work Relieving factors: none reported  PRECAUTIONS: None  RED FLAGS: None   WEIGHT BEARING RESTRICTIONS: No  FALLS:  Has patient fallen in last 6 months? Yes. 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)  PLOF: Independent  PATIENT GOALS: decrease pain  NEXT MD VISIT: PRN  OBJECTIVE:   DIAGNOSTIC FINDINGS: x ray: OA bilat hips  PATIENT SURVEYS:  FOTO 47    SENSATION: WFL  MUSCLE LENGTH: Hamstrings: Right 108 deg; Left 108 deg Thomas test: negative bilat - Rt hip/sacrum pain with thomas test position  PALPATION: Hip jt mobility WFL lateral glides, inferior glides, anterior glides TTP RT great trochanter, Rt SIJ lateral border Increased mm spasticity Rt glutes  LOWER EXTREMITY ROM:  Active ROM Right eval Left eval 03/14/23  Hip flexion WFL pain WFL   Hip extension     Hip abduction  75% pain 75%   Hip adduction     Hip internal rotation 25% pain 25% Lt: Full and pain free passive; Rt 25% limited pain passive 4/10 pain (post manual 1/10 pain 50% limited)  Hip external rotation 75% 75% Lt: Full and pain free passive; Rt: 25% limited passive 4/10; (post manual no pain and full range)   Knee flexion     Knee extension     Ankle dorsiflexion     Ankle plantarflexion     Ankle inversion     Ankle eversion      (Blank rows = not tested)  LOWER EXTREMITY MMT:  MMT Right eval Left eval  Hip flexion 4 4  Hip  extension 3- 4  Hip abduction 4   Hip adduction    Hip internal rotation    Hip external rotation 4   Knee flexion    Knee extension    Ankle dorsiflexion    Ankle plantarflexion    Ankle inversion    Ankle eversion     (Blank rows = not tested)    TODAY'S TREATMENT:          [facility] Adult PT Treatment:                                                DATE: 03/16/2023 Therapeutic Exercise: NuStep L6 x Wide-leg windshield wiper legs x10 Bridges --> added marching 2x5 Quadruped:  CARS CW/CCW (B) Hydrants x10 --> kick outs x5  Bird dog with YTB x10 (B) Standing: Captain morgan with foam roller 5x5" (B) Hip hike with foam roller up/down wall x10 (B) Diver with lateral ankle stability (YTB) Glute iso at wall with tactile feedback from block --> single leg balance --> slow hip flexion --> diver   [facility] Adult PT Treatment:                                                DATE: 03/14/23 Therapeutic Exercise: NuStep level 5 x 5 minutes UE/LE Sidelying leg taps 2 x 10  Quadruped leg extension 2 x 10  Fire hydrant 2 x 10  Resisted deadlift green band 2 x 10  Squat with UE support 2 x 10  Manual Therapy: Rt hip MWM for IR/ER Rt hip posterior, inferior mobilizations grade II-III Rt LAD 2 x 1 min                                                                                                                       [facility] Adult PT Treatment:  DATE: 03/07/2023 Therapeutic Exercise: NuStep L5 x S/L clamshells RTB to fatigue (25 reps) S/L reverse clamshell YTB to fatigue ( Supine glute bridge blue TB above knees 2x15 Single leg bridge x10 (B) Primal push-up 3x20" Modified side plank 3x10" (B) STS with focus decreasing APT/ SLS lateral & back lunges with slider x10 each (B) Divers to chair (R) x10    PATIENT EDUCATION:  Education details: HEP review Person educated: Patient Education method: Explanation Education comprehension:  verbalized understanding  HOME EXERCISE PROGRAM: Access Code: [email or web address] URL: [email or web address] Date: 03/16/2023 Prepared by: Carlynn Herald  Exercises - Primal Push Up  - 1 x daily - 7 x weekly - 3 sets - 10 reps - Bridge with Hip Abduction and Resistance  - 1 x daily - 7 x weekly - 3 sets - 10 reps - Single Leg Bridge  - 1 x daily - 7 x weekly - 3 sets - 10 reps - Clamshell with Resistance  - 1 x daily - 7 x weekly - 3 sets - 10 reps - Sidelying Reverse Clamshell with Resistance  - 1 x daily - 7 x weekly - 3 sets - 10 reps - Side Plank on Knees  - 1 x daily - 7 x weekly - 3 sets - 3-5 reps - 5-10 sec hold - Quadruped Hip Abduction with Resistance Loop  - 1 x daily - 7 x weekly - 3 sets - 10 reps - The Diver  - 1 x daily - 7 x weekly - 3 sets - 10 reps - Single Leg Stance  - 1 x daily - 7 x weekly - 3 sets - 10 reps - Bird Dog with Resistance  - 1 x daily - 7 x weekly - 3 sets - 10 reps  ASSESSMENT:  CLINICAL IMPRESSION: Glute isometrics performed in standing with focus on glute med activation in single leg stance. Hip hiking exercise modified with smaller range and utilizing yoga block for tactile feedback. Improved ankle and hip stability noted in single leg balance activity with yoga block cueing at wall.   OBJECTIVE IMPAIRMENTS: decreased activity tolerance, decreased ROM, decreased strength, impaired flexibility, and pain.   ACTIVITY LIMITATIONS: sitting and standing  PARTICIPATION LIMITATIONS: driving, community activity, and occupation  PERSONAL FACTORS: Past/current experiences and Time since onset of injury/illness/exacerbation are also affecting patient's functional outcome.   REHAB POTENTIAL: Good  CLINICAL DECISION MAKING: Stable/uncomplicated  EVALUATION COMPLEXITY: Low   GOALS: Goals reviewed with patient? Yes  SHORT TERM GOALS: Target date: 03/01/2023  Pt will be independent with initial HEP Baseline: Goal status: MET  2.  Pt will  tolerate 50% Rt hip IR ROM with pain <= 1/10 Baseline:  Goal status: MET    LONG TERM GOALS: Target date: 03/29/2023  Pt will be independent with advanced HEP Baseline:  Goal status: INITIAL  2.  Pt will improve FOTO to >= 64 to demo improved functional mobility Baseline:  Goal status: INITIAL  3.  Pt will tolerate 75% ROM Rt hip IR and abd with pain <= 1/10 Baseline:  Goal status: INITIAL  4.  Pt will report getting up after sitting x 2 hours with pain <= 1/10 Baseline:  Goal status: INITIAL  5.  Pt will improve Rt hip strength to 4+/5 Baseline:  Goal status: INITIAL    PLAN:  PT FREQUENCY: 2x/week  PT DURATION: 6 weeks  PLANNED INTERVENTIONS: Therapeutic exercises, Therapeutic activity, Neuromuscular re-education, Balance training, Gait training, Patient/Family education, Self Care, Joint mobilization, Aquatic Therapy,  Dry Needling, Electrical stimulation, Cryotherapy, Moist heat, Taping, Ultrasound, Ionotophoresis 4mg /ml Dexamethasone, Manual therapy, and Re-evaluation  PLAN FOR NEXT SESSION: Hip stability in SLS with block at wall. Progress hip and core strengthening, manual rt hip prn. Progress standing strengthening.   Carlynn Herald, PTA 03/16/23 8:50 AM

## 2023-03-21 ENCOUNTER — Ambulatory Visit: Payer: No Typology Code available for payment source

## 2023-03-21 DIAGNOSIS — R6889 Other general symptoms and signs: Secondary | ICD-10-CM

## 2023-03-21 DIAGNOSIS — M25551 Pain in right hip: Secondary | ICD-10-CM

## 2023-03-21 DIAGNOSIS — M6281 Muscle weakness (generalized): Secondary | ICD-10-CM

## 2023-03-21 NOTE — Therapy (Signed)
OUTPATIENT PHYSICAL THERAPY LOWER EXTREMITY TREATMENT   Patient Name: Sylvia Meyer MRN: [id number] DOB:05-Feb-1971, 52 y.o., female Today's Date: 03/21/2023  END OF SESSION:  PT End of Session - 03/21/23 0800     Visit Number 7    Number of Visits 12    Date for PT Re-Evaluation 03/29/23    Authorization Type [employer]    Authorization Time Period 120 VISITS PER YEAR COMBINED WITH OT    PT Start Time 0800    PT Stop Time 0840    PT Time Calculation (min) 40 min    Activity Tolerance Patient tolerated treatment well    Behavior During Therapy WFL for tasks assessed/performed            Past Medical History:  Diagnosis Date   Allergy    Anemia    Anxiety    Depression    Family history of breast cancer    Family history of colon cancer    Family history of colon cancer    Family history of prostate cancer 12/22/2022   History reviewed. No pertinent surgical history. Patient Active Problem List   Diagnosis Date Noted   Genetic testing 12/30/2022   Family history of breast cancer 12/22/2022   Family history of colon cancer 12/22/2022   Family history of prostate cancer 12/22/2022   Large breasts 10/26/2017   Chronic venous insufficiency 10/19/2017   Varicose veins of bilateral lower extremities with other complications 10/19/2017   Poor posture 09/14/2017   Nonallopathic lesion of cervical region 09/14/2017   Slipped rib syndrome 09/14/2017   Nonallopathic lesion of rib cage 09/14/2017   Nonallopathic lesion of thoracic region 09/14/2017   Encounter for general adult medical examination with abnormal findings 08/30/2017   Allergic rhinitis 08/30/2017   Adjustment disorder with mixed anxiety and depressed mood 07/20/2015    PCP: Abbe Amsterdam  REFERRING PROVIDER: Abbe Amsterdam  REFERRING DIAG: Right hip pain  THERAPY DIAG:  Pain in right hip  Muscle weakness (generalized)  Decreased functional activity tolerance  Rationale for Evaluation and  Treatment: Rehabilitation  ONSET DATE: June 2024  SUBJECTIVE:   SUBJECTIVE STATEMENT: Patient reports she had mild pain in R low back the evening after last visit; states pain in R anterior/lateral hip and low back increased but not as bad as at initial eval. Patient states she was sore has been sore in bilateral quads and has had stiffness and joint pain in legs off and on over the weekend due to yardwork and working out.    PERTINENT HISTORY: Multiple falls PAIN:  Are you having pain? Yes: NPRS scale: 0 currently; 3 at worst /10 Pain location: bilat hips Rt > Lt Pain description: sore, achey Aggravating factors: stairs; yard work Relieving factors: none reported  PRECAUTIONS: None  RED FLAGS: None   WEIGHT BEARING RESTRICTIONS: No  FALLS:  Has patient fallen in last 6 months? Yes. 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)  PLOF: Independent  PATIENT GOALS: decrease pain  NEXT MD VISIT: PRN  OBJECTIVE:   DIAGNOSTIC FINDINGS: x ray: OA bilat hips  PATIENT SURVEYS:  FOTO 47    SENSATION: WFL  MUSCLE LENGTH: Hamstrings: Right 108 deg; Left 108 deg Thomas test: negative bilat - Rt hip/sacrum pain with thomas test position  PALPATION: Hip jt mobility WFL lateral glides, inferior glides, anterior glides TTP RT great trochanter, Rt SIJ lateral border Increased mm spasticity Rt glutes  LOWER EXTREMITY ROM:  Active ROM  Right eval Left eval 03/14/23  Hip flexion WFL pain WFL   Hip extension     Hip abduction 75% pain 75%   Hip adduction     Hip internal rotation 25% pain 25% Lt: Full and pain free passive; Rt 25% limited pain passive 4/10 pain (post manual 1/10 pain 50% limited)  Hip external rotation 75% 75% Lt: Full and pain free passive; Rt: 25% limited passive 4/10; (post manual no pain and full range)   Knee flexion     Knee extension     Ankle dorsiflexion     Ankle plantarflexion     Ankle inversion     Ankle  eversion      (Blank rows = not tested)  LOWER EXTREMITY MMT:  MMT Right eval Left eval  Hip flexion 4 4  Hip extension 3- 4  Hip abduction 4   Hip adduction    Hip internal rotation    Hip external rotation 4   Knee flexion    Knee extension    Ankle dorsiflexion    Ankle plantarflexion    Ankle inversion    Ankle eversion     (Blank rows = not tested)    TODAY'S TREATMENT:          [facility] Adult PT Treatment:                                                DATE: 03/21/2023 Therapeutic Exercise: NuStep L5 x (R) SLR in ER 2x10 Quadruped:  CARs CW/CCW Hydrants YTB x10 --> added leg extension YTB x10 Side Lying: Pilates leg kicks front/back 3-way glute med straight leg raises in IR (low, mid, high) x10 each Hip add leg lifts x10 --> added pulses x10  Figure 4 LTR   [facility] Adult PT Treatment:                                                DATE: 03/16/2023 Therapeutic Exercise: NuStep L6 x Wide-leg windshield wiper legs x10 Bridges --> added marching 2x5 Quadruped:  CARS CW/CCW (B) Hydrants x10 --> kick outs x5  Bird dog with YTB x10 (B) Standing: Captain morgan with foam roller 5x5" (B) Hip hike with foam roller up/down wall x10 (B) Diver with lateral ankle stability (YTB) Glute iso at wall with tactile feedback from block --> single leg balance --> slow hip flexion --> diver   [facility] Adult PT Treatment:                                                DATE: 03/14/23 Therapeutic Exercise: NuStep level 5 x 5 minutes UE/LE Sidelying leg taps 2 x 10  Quadruped leg extension 2 x 10  Fire hydrant 2 x 10  Resisted deadlift green band 2 x 10  Squat with UE support 2 x 10  Manual Therapy: Rt hip MWM for IR/ER Rt hip posterior, inferior mobilizations grade II-III Rt LAD 2 x 1 min  PATIENT EDUCATION:  Education details: HEP review Person educated:  Patient Education method: Explanation Education comprehension: verbalized understanding  HOME EXERCISE PROGRAM: Access Code: [email or web address] URL: [email or web address] Date: 03/21/2023 Prepared by: Carlynn Herald  Exercises - Primal Push Up  - 1 x daily - 7 x weekly - 3 sets - 10 reps - Bridge with Hip Abduction and Resistance  - 1 x daily - 7 x weekly - 3 sets - 10 reps - Single Leg Bridge  - 1 x daily - 7 x weekly - 3 sets - 10 reps - Clamshell with Resistance  - 1 x daily - 7 x weekly - 3 sets - 10 reps - Sidelying Reverse Clamshell with Resistance  - 1 x daily - 7 x weekly - 3 sets - 10 reps - Side Plank on Knees  - 1 x daily - 7 x weekly - 3 sets - 3-5 reps - 5-10 sec hold - Quadruped Hip Abduction with Resistance Loop  - 1 x daily - 7 x weekly - 3 sets - 10 reps - The Diver  - 1 x daily - 7 x weekly - 3 sets - 10 reps - Single Leg Stance  - 1 x daily - 7 x weekly - 3 sets - 10 reps - Bird Dog with Resistance  - 1 x daily - 7 x weekly - 3 sets - 10 reps - Beginner Inside Leg Lift   - 1 x daily - 7 x weekly - 3 sets - 10 reps - Sidelying Hip Abduction  - 1 x daily - 7 x weekly - 3 sets - 10 reps  ASSESSMENT:  CLINICAL IMPRESSION: Cueing provided to decrease noted ribcage flare in side lying during hip abd variations. Providing support underneath R waist in side lying alleviated right-sides low back discomfort.    OBJECTIVE IMPAIRMENTS: decreased activity tolerance, decreased ROM, decreased strength, impaired flexibility, and pain.   ACTIVITY LIMITATIONS: sitting and standing  PARTICIPATION LIMITATIONS: driving, community activity, and occupation  PERSONAL FACTORS: Past/current experiences and Time since onset of injury/illness/exacerbation are also affecting patient's functional outcome.   REHAB POTENTIAL: Good  CLINICAL DECISION MAKING: Stable/uncomplicated  EVALUATION COMPLEXITY: Low   GOALS: Goals reviewed with patient? Yes  SHORT TERM GOALS: Target date:  03/01/2023  Pt will be independent with initial HEP Baseline: Goal status: MET  2.  Pt will tolerate 50% Rt hip IR ROM with pain <= 1/10 Baseline:  Goal status: MET    LONG TERM GOALS: Target date: 03/29/2023  Pt will be independent with advanced HEP Baseline:  Goal status: INITIAL  2.  Pt will improve FOTO to >= 64 to demo improved functional mobility Baseline:  Goal status: INITIAL  3.  Pt will tolerate 75% ROM Rt hip IR and abd with pain <= 1/10 Baseline:  Goal status: INITIAL  4.  Pt will report getting up after sitting x 2 hours with pain <= 1/10 Baseline:  Goal status: INITIAL  5.  Pt will improve Rt hip strength to 4+/5 Baseline:  Goal status: INITIAL    PLAN:  PT FREQUENCY: 2x/week  PT DURATION: 6 weeks  PLANNED INTERVENTIONS: Therapeutic exercises, Therapeutic activity, Neuromuscular re-education, Balance training, Gait training, Patient/Family education, Self Care, Joint mobilization, Aquatic Therapy, Dry Needling, Electrical stimulation, Cryotherapy, Moist heat, Taping, Ultrasound, Ionotophoresis 4mg /ml Dexamethasone, Manual therapy, and Re-evaluation  PLAN FOR NEXT SESSION: Progress hip and core strengthening, manual rt hip prn. Progress standing strengthening.   Carlynn Herald, PTA 03/21/23 8:42 AM

## 2023-03-23 ENCOUNTER — Ambulatory Visit: Payer: No Typology Code available for payment source

## 2023-03-23 DIAGNOSIS — R6889 Other general symptoms and signs: Secondary | ICD-10-CM

## 2023-03-23 DIAGNOSIS — M25551 Pain in right hip: Secondary | ICD-10-CM

## 2023-03-23 DIAGNOSIS — M6281 Muscle weakness (generalized): Secondary | ICD-10-CM

## 2023-03-23 NOTE — Therapy (Signed)
OUTPATIENT PHYSICAL THERAPY LOWER EXTREMITY TREATMENT   Patient Name: Sylvia Meyer MRN: [id number] DOB:[birth year], 52 y.o., female Today's Date: 03/23/2023  END OF SESSION:  PT End of Session - 03/23/23 0805     Visit Number 8    Number of Visits 12    Date for PT Re-Evaluation 03/29/23    Authorization Type [employer]    Authorization Time Period 120 VISITS PER YEAR COMBINED WITH OT    PT Start Time 0805    PT Stop Time 0845    PT Time Calculation (min) 40 min    Activity Tolerance Patient tolerated treatment well    Behavior During Therapy WFL for tasks assessed/performed            Past Medical History:  Diagnosis Date   Allergy    Anemia    Anxiety    Depression    Family history of breast cancer    Family history of colon cancer    Family history of colon cancer    Family history of prostate cancer 12/22/2022   History reviewed. No pertinent surgical history. Patient Active Problem List   Diagnosis Date Noted   Genetic testing 12/30/2022   Family history of breast cancer 12/22/2022   Family history of colon cancer 12/22/2022   Family history of prostate cancer 12/22/2022   Large breasts 10/26/2017   Chronic venous insufficiency 10/19/2017   Varicose veins of bilateral lower extremities with other complications 10/19/2017   Poor posture 09/14/2017   Nonallopathic lesion of cervical region 09/14/2017   Slipped rib syndrome 09/14/2017   Nonallopathic lesion of rib cage 09/14/2017   Nonallopathic lesion of thoracic region 09/14/2017   Encounter for general adult medical examination with abnormal findings 08/30/2017   Allergic rhinitis 08/30/2017   Adjustment disorder with mixed anxiety and depressed mood 07/20/2015    PCP: Abbe Amsterdam  REFERRING PROVIDER: Abbe Amsterdam  REFERRING DIAG: Right hip pain  THERAPY DIAG:  Pain in right hip  Muscle weakness (generalized)  Decreased functional activity tolerance  Rationale for Evaluation and  Treatment: Rehabilitation  ONSET DATE: June 2024  SUBJECTIVE:   SUBJECTIVE STATEMENT: Patient reports she has been getting a "catching" in R groin when walking and 3/10 pain on R side of low back    PERTINENT HISTORY: Multiple falls PAIN:  Are you having pain? Yes: NPRS scale: 0 currently; 3 at worst /10 Pain location: bilat hips Rt > Lt Pain description: sore, achey Aggravating factors: stairs; yard work Relieving factors: none reported  PRECAUTIONS: None  RED FLAGS: None   WEIGHT BEARING RESTRICTIONS: No  FALLS:  Has patient fallen in last 6 months? Yes. 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)  PLOF: Independent  PATIENT GOALS: decrease pain  NEXT MD VISIT: PRN  OBJECTIVE:   DIAGNOSTIC FINDINGS: x ray: OA bilat hips  PATIENT SURVEYS:  FOTO 47    SENSATION: WFL  MUSCLE LENGTH: Hamstrings: Right 108 deg; Left 108 deg Thomas test: negative bilat - Rt hip/sacrum pain with thomas test position  PALPATION: Hip jt mobility WFL lateral glides, inferior glides, anterior glides TTP RT great trochanter, Rt SIJ lateral border Increased mm spasticity Rt glutes  LOWER EXTREMITY ROM:  Active ROM Right eval Left eval 03/14/23  Hip flexion WFL pain WFL   Hip extension     Hip abduction 75% pain 75%   Hip adduction     Hip internal rotation 25% pain 25% Lt: Full and pain free  passive; Rt 25% limited pain passive 4/10 pain (post manual 1/10 pain 50% limited)  Hip external rotation 75% 75% Lt: Full and pain free passive; Rt: 25% limited passive 4/10; (post manual no pain and full range)   Knee flexion     Knee extension     Ankle dorsiflexion     Ankle plantarflexion     Ankle inversion     Ankle eversion      (Blank rows = not tested)  LOWER EXTREMITY MMT:  MMT Right eval Left eval  Hip flexion 4 4  Hip extension 3- 4  Hip abduction 4   Hip adduction    Hip internal rotation    Hip external rotation 4   Knee flexion     Knee extension    Ankle dorsiflexion    Ankle plantarflexion    Ankle inversion    Ankle eversion     (Blank rows = not tested)    TODAY'S TREATMENT:          [facility] Adult PT Treatment:                                                DATE: 03/23/2023 Therapeutic Exercise: Standing PPT at wall SIJ stretch: standing with R foot on chair, L arm reach underneath R leg 5x10" (pinching pain in R groin) Supine butterfly stretch x1' S/L clamshells GTB 3x15 Figure 4 stretch Standing hip hike with ball (mild pinch in R hip) S/L with back against wall --> leg kicks front 2x10 (towel underneath bottom waist)   [facility] Adult PT Treatment:                                                DATE: 03/21/2023 Therapeutic Exercise: NuStep L5 x (R) SLR in ER 2x10 Quadruped:  CARs CW/CCW Hydrants YTB x10 --> added leg extension YTB x10 Side Lying: Pilates leg kicks front/back 3-way glute med straight leg raises in IR (low, mid, high) x10 each Hip add leg lifts x10 --> added pulses x10  Figure 4 LTR    [facility] Adult PT Treatment:                                                DATE: 03/16/2023 Therapeutic Exercise: NuStep L6 x Wide-leg windshield wiper legs x10 Bridges --> added marching 2x5 Quadruped:  CARS CW/CCW (B) Hydrants x10 --> kick outs x5  Bird dog with YTB x10 (B) Standing: Captain morgan with foam roller 5x5" (B) Hip hike with foam roller up/down wall x10 (B) Diver with lateral ankle stability (YTB) Glute iso at wall with tactile feedback from block --> single leg balance --> slow hip flexion --> diver  PATIENT EDUCATION:  Education details: HEP review Person educated: Patient Education method: Explanation Education comprehension: verbalized understanding  HOME EXERCISE PROGRAM: Access Code: [email or web address] URL: [email or web address] Date:  03/23/2023 Prepared by: Carlynn Herald  Exercises - Primal Push Up  - 1 x daily - 7 x weekly - 3 sets - 10 reps - Bridge with Hip Abduction and Resistance  - 1 x daily - 7 x weekly - 3 sets - 10 reps - Single Leg Bridge  - 1 x daily - 7 x weekly - 3 sets - 10 reps - Clamshell with Resistance  - 1 x daily - 7 x weekly - 3 sets - 10 reps - Sidelying Reverse Clamshell with Resistance  - 1 x daily - 7 x weekly - 3 sets - 10 reps - Side Plank on Knees  - 1 x daily - 7 x weekly - 3 sets - 3-5 reps - 5-10 sec hold - Quadruped Hip Abduction with Resistance Loop  - 1 x daily - 7 x weekly - 3 sets - 10 reps - The Diver  - 1 x daily - 7 x weekly - 3 sets - 10 reps - Single Leg Stance  - 1 x daily - 7 x weekly - 3 sets - 10 reps - Bird Dog with Resistance  - 1 x daily - 7 x weekly - 3 sets - 10 reps - Beginner Inside Leg Lift   - 1 x daily - 7 x weekly - 3 sets - 10 reps - Sidelying Hip Abduction  - 1 x daily - 7 x weekly - 3 sets - 10 reps - Sidelying Hip Abduction on Wall  - 1 x daily - 7 x weekly - 3 sets - 10 reps - 90/90 SI Joint Self-Correction  - 1 x daily - 7 x weekly - 1 sets - 3-5 reps - 5-10 sec hold  ASSESSMENT:  CLINICAL IMPRESSION: Increased R groin pain with R hip hike and walking, however no increase of pain with deep hip flexion. Patient reported relief with overpressure on R SIJ. Noted fatigue in R glute during forward leg kicks in side lying with pelvic stability feedback from wall.    OBJECTIVE IMPAIRMENTS: decreased activity tolerance, decreased ROM, decreased strength, impaired flexibility, and pain.   ACTIVITY LIMITATIONS: sitting and standing  PARTICIPATION LIMITATIONS: driving, community activity, and occupation  PERSONAL FACTORS: Past/current experiences and Time since onset of injury/illness/exacerbation are also affecting patient's functional outcome.   REHAB POTENTIAL: Good  CLINICAL DECISION MAKING: Stable/uncomplicated  EVALUATION COMPLEXITY:  Low   GOALS: Goals reviewed with patient? Yes  SHORT TERM GOALS: Target date: 03/01/2023  Pt will be independent with initial HEP Baseline: Goal status: MET  2.  Pt will tolerate 50% Rt hip IR ROM with pain <= 1/10 Baseline:  Goal status: MET    LONG TERM GOALS: Target date: 03/29/2023  Pt will be independent with advanced HEP Baseline:  Goal status: INITIAL  2.  Pt will improve FOTO to >= 64 to demo improved functional mobility Baseline:  Goal status: INITIAL  3.  Pt will tolerate 75% ROM Rt hip IR and abd with pain <= 1/10 Baseline:  Goal status: INITIAL  4.  Pt will report getting up after sitting x 2 hours with pain <= 1/10 Baseline:  Goal status: INITIAL  5.  Pt will improve Rt hip strength to 4+/5 Baseline:  Goal status: INITIAL    PLAN:  PT FREQUENCY: 2x/week  PT DURATION: 6 weeks  PLANNED  INTERVENTIONS: Therapeutic exercises, Therapeutic activity, Neuromuscular re-education, Balance training, Gait training, Patient/Family education, Self Care, Joint mobilization, Aquatic Therapy, Dry Needling, Electrical stimulation, Cryotherapy, Moist heat, Taping, Ultrasound, Ionotophoresis 4mg /ml Dexamethasone, Manual therapy, and Re-evaluation  PLAN FOR NEXT SESSION: Re-eval next visit    Carlynn Herald, PTA 03/23/23 9:38 AM

## 2023-03-28 ENCOUNTER — Ambulatory Visit: Payer: No Typology Code available for payment source

## 2023-03-28 DIAGNOSIS — M25551 Pain in right hip: Secondary | ICD-10-CM | POA: Diagnosis not present

## 2023-03-28 DIAGNOSIS — M6281 Muscle weakness (generalized): Secondary | ICD-10-CM

## 2023-03-28 DIAGNOSIS — R6889 Other general symptoms and signs: Secondary | ICD-10-CM

## 2023-03-28 NOTE — Patient Instructions (Signed)

## 2023-03-28 NOTE — Therapy (Signed)
OUTPATIENT PHYSICAL THERAPY LOWER EXTREMITY TREATMENT RE-CERTIFICATION   Patient Name: Sylvia Meyer MRN: [id number] DOB:Oct 30, 1970, 52 y.o., female Today's Date: 03/28/2023  END OF SESSION:  PT End of Session - 03/28/23 0930     Visit Number 9    Number of Visits 15    Date for PT Re-Evaluation 05/14/23    Authorization Type Aet[employer]    Authorization Time Period 120 VISITS PER YEAR COMBINED WITH OT    PT Start Time 0930    PT Stop Time 1015    PT Time Calculation (min) 45 min    Activity Tolerance Patient tolerated treatment well    Behavior During Therapy WFL for tasks assessed/performed             Past Medical History:  Diagnosis Date   Allergy    Anemia    Anxiety    Depression    Family history of breast cancer    Family history of colon cancer    Family history of colon cancer    Family history of prostate cancer 12/22/2022   History reviewed. No pertinent surgical history. Patient Active Problem List   Diagnosis Date Noted   Genetic testing 12/30/2022   Family history of breast cancer 12/22/2022   Family history of colon cancer 12/22/2022   Family history of prostate cancer 12/22/2022   Large breasts 10/26/2017   Chronic venous insufficiency 10/19/2017   Varicose veins of bilateral lower extremities with other complications 10/19/2017   Poor posture 09/14/2017   Nonallopathic lesion of cervical region 09/14/2017   Slipped rib syndrome 09/14/2017   Nonallopathic lesion of rib cage 09/14/2017   Nonallopathic lesion of thoracic region 09/14/2017   Encounter for general adult medical examination with abnormal findings 08/30/2017   Allergic rhinitis 08/30/2017   Adjustment disorder with mixed anxiety and depressed mood 07/20/2015    PCP: Abbe Amsterdam  REFERRING PROVIDER: Abbe Amsterdam  REFERRING DIAG: Right hip pain  THERAPY DIAG:  Pain in right hip  Muscle weakness (generalized)  Decreased functional activity tolerance  Rationale for  Evaluation and Treatment: Rehabilitation  ONSET DATE: June 2024  SUBJECTIVE:   SUBJECTIVE STATEMENT: Patient reports overall her pain is much better. She was sore in her back after last time. By Saturday she wasn't having any pain. She can tell an improvement in her strength and walking since the start of care. More of her pain recently has been about the SI joint/posterior hip.   PERTINENT HISTORY: Multiple falls PAIN:  Are you having pain? Yes: NPRS scale: none currently; 6 at worst/10 Pain location: Rt SI joint; posterior hips Pain description: sore, achey Aggravating factors: stairs; yard work Relieving factors: none reported  PRECAUTIONS: None  RED FLAGS: None   WEIGHT BEARING RESTRICTIONS: No  FALLS:  Has patient fallen in last 6 months? Yes. 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)  PLOF: Independent  PATIENT GOALS: decrease pain  NEXT MD VISIT: PRN  OBJECTIVE:   DIAGNOSTIC FINDINGS: x ray: OA bilat hips  PATIENT SURVEYS:  FOTO 47  03/28/23: 75% function  SENSATION: WFL  MUSCLE LENGTH: Hamstrings: Right 108 deg; Left 108 deg Thomas test: negative bilat - Rt hip/sacrum pain with thomas test position  PALPATION: Hip jt mobility WFL lateral glides, inferior glides, anterior glides TTP RT great trochanter, Rt SIJ lateral border Increased mm spasticity Rt glutes  03/28/23: Rt ASIS and iliac crest elevated in standing LUMBAR ROM:   Active  A/PROM  03/28/23  Flexion Full   Extension WFL Tightness in anterior hips  Right lateral flexion WFL pinch Rt SIJ  Left lateral flexion WFL  Right rotation [facility]  Left rotation WFL   (Blank rows = not tested)  LOWER EXTREMITY ROM:  Active ROM Right eval Left eval 03/14/23 03/28/23  Hip flexion WFL pain WFL  WFL bilateral   Hip extension      Hip abduction 75% pain 75%  25% limited Rt ; pain Rt 5/10  Hip adduction      Hip internal rotation 25% pain 25% Lt: Full and pain  free passive; Rt 25% limited pain passive 4/10 pain (post manual 1/10 pain 50% limited) 25% limited Rt 3/10 pn on Rt  Hip external rotation 75% 75% Lt: Full and pain free passive; Rt: 25% limited passive 4/10; (post manual no pain and full range)  WFL bilateral 1/0 pn on Rt   Knee flexion      Knee extension      Ankle dorsiflexion      Ankle plantarflexion      Ankle inversion      Ankle eversion       (Blank rows = not tested)  LOWER EXTREMITY MMT:  MMT Right eval Left eval 03/28/23  Hip flexion 4 4 5  bilateral   Hip extension 3- 4 5 bilateral   Hip abduction 4  5 bilateral   Hip adduction     Hip internal rotation   5 bilateral   Hip external rotation 4  5 bilateral  Knee flexion     Knee extension     Ankle dorsiflexion     Ankle plantarflexion     Ankle inversion     Ankle eversion      (Blank rows = not tested)  SPECIAL TESTS: 03/28/23:   (+) Long Sit (+) Thigh Thrust (-) FABER, sacral thrust, Gaenslen's    TODAY'S TREATMENT:          [facility] Adult PT Treatment:                                                DATE: 03/28/23 Therapeutic Exercise: Reviewed and updated HEP Hip flexor stretch x 30 sec each  Figure hydrant x 5 each  Manual Therapy: Muscle energy technique to improve pelvic alignment  STM Rt gluteals Trigger Point Dry Needling Treatment: Pre-treatment instruction: Patient instructed on dry needling rationale, procedures, and possible side effects including pain during treatment (achy,cramping feeling), bruising, drop of blood, lightheadedness, nausea, sweating. Patient Consent Given: Yes Education handout provided: Yes Muscles treated: Rt gluteals   Treatment response/outcome: Twitch response elicited and Palpable decrease in muscle tension Post-treatment instructions: Patient instructed to expect possible mild to moderate muscle soreness later today and/or tomorrow. Patient instructed in methods to reduce muscle soreness and to continue prescribed  HEP. If patient was dry needled over the lung field, patient was instructed on signs and symptoms of pneumothorax and, however unlikely, to see immediate medical attention should they occur. Patient was also educated on signs and symptoms of infection and to seek medical attention should they occur. Patient verbalized understanding of these instructions and education. Therapeutic Activity: Re-assessment to determine overall progress, educating patient on progress towards goals.    [facility] Adult PT Treatment:  DATE: 03/23/2023 Therapeutic Exercise: Standing PPT at wall SIJ stretch: standing with R foot on chair, L arm reach underneath R leg 5x10" (pinching pain in R groin) Supine butterfly stretch x1' S/L clamshells GTB 3x15 Figure 4 stretch Standing hip hike with ball (mild pinch in R hip) S/L with back against wall --> leg kicks front 2x10 (towel underneath bottom waist)   [facility] Adult PT Treatment:                                                DATE: 03/21/2023 Therapeutic Exercise: NuStep L5 x (R) SLR in ER 2x10 Quadruped:  CARs CW/CCW Hydrants YTB x10 --> added leg extension YTB x10 Side Lying: Pilates leg kicks front/back 3-way glute med straight leg raises in IR (low, mid, high) x10 each Hip add leg lifts x10 --> added pulses x10  Figure 4 LTR    [facility] Adult PT Treatment:                                                DATE: 03/16/2023 Therapeutic Exercise: NuStep L6 x Wide-leg windshield wiper legs x10 Bridges --> added marching 2x5 Quadruped:  CARS CW/CCW (B) Hydrants x10 --> kick outs x5  Bird dog with YTB x10 (B) Standing: Captain morgan with foam roller 5x5" (B) Hip hike with foam roller up/down wall x10 (B) Diver with lateral ankle stability (YTB) Glute iso at wall with tactile feedback from block --> single leg balance --> slow hip flexion --> diver                                                                                                                      PATIENT EDUCATION:  Education details: see treatment Person educated: Patient Education method: Explanation, demo, cues, handout Education comprehension: verbalized understanding, returned demo   HOME EXERCISE PROGRAM: Access Code: [email or web address] URL: [email or web address] Date: 03/28/2023 Prepared by: Letitia Libra  Exercises - Bridge with Hip Abduction and Resistance  - 1 x daily - 7 x weekly - 3 sets - 10 reps - Single Leg Bridge  - 1 x daily - 7 x weekly - 3 sets - 10 reps - Clamshell with Resistance  - 1 x daily - 7 x weekly - 3 sets - 10 reps - Sidelying Reverse Clamshell with Resistance  - 1 x daily - 7 x weekly - 3 sets - 10 reps - Side Plank on Knees  - 1 x daily - 7 x weekly - 3 sets - 3-5 reps - 5-10 sec hold - Quadruped Hip Abduction with Resistance Loop  - 1 x daily - 7 x weekly - 3 sets - 10 reps - The Diver  - 1 x daily - 7  x weekly - 3 sets - 10 reps - Single Leg Stance  - 1 x daily - 7 x weekly - 3 sets - 10 reps - Bird Dog with Resistance  - 1 x daily - 7 x weekly - 3 sets - 10 reps - Beginner Inside Leg Lift   - 1 x daily - 7 x weekly - 3 sets - 10 reps - Sidelying Hip Abduction  - 1 x daily - 7 x weekly - 3 sets - 10 reps - Sidelying Hip Abduction on Wall  - 1 x daily - 7 x weekly - 3 sets - 10 reps - 90/90 SI Joint Self-Correction  - 1 x daily - 7 x weekly - 1 sets - 3-5 reps - 5-10 sec hold - Half Kneeling Hip Flexor Stretch  - 1 x daily - 7 x weekly - 3 sets - 30 sec  hold  ASSESSMENT:  CLINICAL IMPRESSION: Patient is progressing well in PT in regards to her Rt hip pain. She demonstrates full strength about bilateral hips and reports improved tolerance to sitting and walking activity. She continues to have limited and painful Rt hip AROM, tautness and palpable tenderness about Rt posterior hip musculature, asymmetrical pelvic alignment, and has recently been experiencing more pain about the posterior hip  and SI region. TPDN performed to gluteals and manual techniques to improve pelvic alignment were performed today. She will benefit from continued skilled PT 1 x week for 6 weeks to address above lingering deficits in order to optimize her function and assist in pain reduction.    OBJECTIVE IMPAIRMENTS: decreased activity tolerance, decreased ROM, decreased strength, impaired flexibility, and pain.   ACTIVITY LIMITATIONS: sitting and standing  PARTICIPATION LIMITATIONS: driving, community activity, and occupation  PERSONAL FACTORS: Past/current experiences and Time since onset of injury/illness/exacerbation are also affecting patient's functional outcome.   REHAB POTENTIAL: Good  CLINICAL DECISION MAKING: Stable/uncomplicated  EVALUATION COMPLEXITY: Low   GOALS: Goals reviewed with patient? Yes  SHORT TERM GOALS: Target date: 03/01/2023  Pt will be independent with initial HEP Baseline: Goal status: MET  2.  Pt will tolerate 50% Rt hip IR ROM with pain <= 1/10 Baseline:  Goal status: MET    LONG TERM GOALS: Target date: 05/14/23  Pt will be independent with advanced HEP Baseline:  Goal status: ongoing   2.  Pt will improve FOTO to >= 64 to demo improved functional mobility Baseline:  Goal status: MET  3.  Pt will tolerate 75% ROM Rt hip IR and abd with pain <= 1/10 Baseline: 5/10 pain  Goal status: ongoing   4.  Pt will report getting up after sitting x 2 hours with pain <= 1/10 Baseline:  03/28/23: reports soreness when she stands after sitting Goal status: MET  5.  Pt will improve Rt hip strength to 4+/5 Baseline:  Goal status: MET  6. Patient will maintain symmetrical pelvic alignment between sessions.   Baseline: see above  Goal status: NEW    PLAN:  PT FREQUENCY: 1x/week  PT DURATION: 6 weeks  PLANNED INTERVENTIONS: Therapeutic exercises, Therapeutic activity, Neuromuscular re-education, Balance training, Gait training, Patient/Family education,  Self Care, Joint mobilization, Aquatic Therapy, Dry Needling, Electrical stimulation, Cryotherapy, Moist heat, Taping, Ultrasound, Ionotophoresis 4mg /ml Dexamethasone, Manual therapy, and Re-evaluation  PLAN FOR NEXT SESSION: TPDN response; assess hip levels; core strengthening; hip mobility    Letitia Libra, PT, DPT, ATC 03/28/23 1:05 PM

## 2023-04-06 ENCOUNTER — Ambulatory Visit: Payer: No Typology Code available for payment source

## 2023-04-06 DIAGNOSIS — M25551 Pain in right hip: Secondary | ICD-10-CM | POA: Diagnosis not present

## 2023-04-06 DIAGNOSIS — R6889 Other general symptoms and signs: Secondary | ICD-10-CM

## 2023-04-06 DIAGNOSIS — M6281 Muscle weakness (generalized): Secondary | ICD-10-CM

## 2023-04-06 NOTE — Therapy (Signed)
OUTPATIENT PHYSICAL THERAPY LOWER EXTREMITY TREATMENT    Patient Name: Sylvia Meyer MRN: [id number] DOB:02-09-1971, 52 y.o., female Today's Date: 04/06/2023  END OF SESSION:  PT End of Session - 04/06/23 0802     Visit Number 10    Number of Visits 15    Date for PT Re-Evaluation 05/14/23    Authorization Type [employer]    Authorization Time Period 120 VISITS PER YEAR COMBINED WITH OT    PT Start Time 0802    PT Stop Time 0845    PT Time Calculation (min) 43 min    Activity Tolerance Patient tolerated treatment well    Behavior During Therapy WFL for tasks assessed/performed              Past Medical History:  Diagnosis Date   Allergy    Anemia    Anxiety    Depression    Family history of breast cancer    Family history of colon cancer    Family history of colon cancer    Family history of prostate cancer 12/22/2022   History reviewed. No pertinent surgical history. Patient Active Problem List   Diagnosis Date Noted   Genetic testing 12/30/2022   Family history of breast cancer 12/22/2022   Family history of colon cancer 12/22/2022   Family history of prostate cancer 12/22/2022   Large breasts 10/26/2017   Chronic venous insufficiency 10/19/2017   Varicose veins of bilateral lower extremities with other complications 10/19/2017   Poor posture 09/14/2017   Nonallopathic lesion of cervical region 09/14/2017   Slipped rib syndrome 09/14/2017   Nonallopathic lesion of rib cage 09/14/2017   Nonallopathic lesion of thoracic region 09/14/2017   Encounter for general adult medical examination with abnormal findings 08/30/2017   Allergic rhinitis 08/30/2017   Adjustment disorder with mixed anxiety and depressed mood 07/20/2015    PCP: Abbe Amsterdam  REFERRING PROVIDER: Abbe Amsterdam  REFERRING DIAG: Right hip pain  THERAPY DIAG:  Pain in right hip  Muscle weakness (generalized)  Decreased functional activity tolerance  Rationale for Evaluation and  Treatment: Rehabilitation  ONSET DATE: June 2024  SUBJECTIVE:   SUBJECTIVE STATEMENT: Patient reports working a lot last week and was hurting in all her lower extremity joints over the weekend. Right now just some pain in the Rt hip.    PERTINENT HISTORY: Multiple falls PAIN:  Are you having pain? Yes: NPRS scale: 1/10 Pain location: Rt hip Pain description: sore, achey Aggravating factors: stairs; yard work Relieving factors: none reported  PRECAUTIONS: None  RED FLAGS: None   WEIGHT BEARING RESTRICTIONS: No  FALLS:  Has patient fallen in last 6 months? Yes. 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)  PLOF: Independent  PATIENT GOALS: decrease pain  NEXT MD VISIT: PRN  OBJECTIVE:   DIAGNOSTIC FINDINGS: x ray: OA bilat hips  PATIENT SURVEYS:  FOTO 47  03/28/23: 75% function  SENSATION: WFL  MUSCLE LENGTH: Hamstrings: Right 108 deg; Left 108 deg Thomas test: negative bilat - Rt hip/sacrum pain with thomas test position  PALPATION: Hip jt mobility WFL lateral glides, inferior glides, anterior glides TTP RT great trochanter, Rt SIJ lateral border Increased mm spasticity Rt glutes  03/28/23: Rt ASIS and iliac crest elevated in standing  04/06/23: symmetrical pelvic alignment  LUMBAR ROM:   Active  A/PROM  03/28/23  Flexion Full   Extension WFL Tightness in anterior hips  Right lateral flexion WFL pinch Rt SIJ  Left lateral  flexion WFL  Right rotation [facility]  Left rotation WFL   (Blank rows = not tested)  LOWER EXTREMITY ROM:  Active ROM Right eval Left eval 03/14/23 03/28/23  Hip flexion WFL pain [facility]  WFL bilateral   Hip extension      Hip abduction 75% pain 75%  25% limited Rt ; pain Rt 5/10  Hip adduction      Hip internal rotation 25% pain 25% Lt: Full and pain free passive; Rt 25% limited pain passive 4/10 pain (post manual 1/10 pain 50% limited) 25% limited Rt 3/10 pn on Rt  Hip external rotation 75% 75% Lt:  Full and pain free passive; Rt: 25% limited passive 4/10; (post manual no pain and full range)  WFL bilateral 1/0 pn on Rt   Knee flexion      Knee extension      Ankle dorsiflexion      Ankle plantarflexion      Ankle inversion      Ankle eversion       (Blank rows = not tested)  LOWER EXTREMITY MMT:  MMT Right eval Left eval 03/28/23  Hip flexion 4 4 5  bilateral   Hip extension 3- 4 5 bilateral   Hip abduction 4  5 bilateral   Hip adduction     Hip internal rotation   5 bilateral   Hip external rotation 4  5 bilateral  Knee flexion     Knee extension     Ankle dorsiflexion     Ankle plantarflexion     Ankle inversion     Ankle eversion      (Blank rows = not tested)  SPECIAL TESTS: 03/28/23:   (+) Long Sit (+) Thigh Thrust (-) FABER, sacral thrust, Gaenslen's    TODAY'S TREATMENT:         [facility] Adult PT Treatment:                                                DATE: 04/06/23 Therapeutic Exercise: Rt glute stretch x 30 sec Butterfly stretch x 30 sec  Donkey kicks 2 x 10  Lateral band walks blue band at shins 2 x 10 ft d/b 3 way hip x 10 each  Supine TA march 2 x 10  90/90 toe tap 2 x 10  Sidelying hip circles 2 x 10; CW/CCW Swimmers 2 x 10  Manual Therapy: Skilled palpation of trigger points  Trigger Point Dry Needling Treatment: Pre-treatment instruction: Patient instructed on dry needling rationale, procedures, and possible side effects including pain during treatment (achy,cramping feeling), bruising, drop of blood, lightheadedness, nausea, sweating. Patient Consent Given: Yes Education handout provided: Previously provided Muscles treated: Rt gluteals   Treatment response/outcome: Twitch response elicited and Palpable decrease in muscle tension Post-treatment instructions: Patient instructed to expect possible mild to moderate muscle soreness later today and/or tomorrow. Patient instructed in methods to reduce muscle soreness and to continue prescribed HEP.  If patient was dry needled over the lung field, patient was instructed on signs and symptoms of pneumothorax and, however unlikely, to see immediate medical attention should they occur. Patient was also educated on signs and symptoms of infection and to seek medical attention should they occur. Patient verbalized understanding of these instructions and education.   [facility] Adult PT Treatment:  DATE: 03/28/23 Therapeutic Exercise: Reviewed and updated HEP Hip flexor stretch x 30 sec each  Figure hydrant x 5 each  Manual Therapy: Muscle energy technique to improve pelvic alignment  STM Rt gluteals Trigger Point Dry Needling Treatment: Pre-treatment instruction: Patient instructed on dry needling rationale, procedures, and possible side effects including pain during treatment (achy,cramping feeling), bruising, drop of blood, lightheadedness, nausea, sweating. Patient Consent Given: Yes Education handout provided: Yes Muscles treated: Rt gluteals   Treatment response/outcome: Twitch response elicited and Palpable decrease in muscle tension Post-treatment instructions: Patient instructed to expect possible mild to moderate muscle soreness later today and/or tomorrow. Patient instructed in methods to reduce muscle soreness and to continue prescribed HEP. If patient was dry needled over the lung field, patient was instructed on signs and symptoms of pneumothorax and, however unlikely, to see immediate medical attention should they occur. Patient was also educated on signs and symptoms of infection and to seek medical attention should they occur. Patient verbalized understanding of these instructions and education. Therapeutic Activity: Re-assessment to determine overall progress, educating patient on progress towards goals.    [facility] Adult PT Treatment:                                                DATE: 03/23/2023 Therapeutic Exercise: Standing PPT at  wall SIJ stretch: standing with R foot on chair, L arm reach underneath R leg 5x10" (pinching pain in R groin) Supine butterfly stretch x1' S/L clamshells GTB 3x15 Figure 4 stretch Standing hip hike with ball (mild pinch in R hip) S/L with back against wall --> leg kicks front 2x10 (towel underneath bottom waist)   [facility] Adult PT Treatment:                                                DATE: 03/21/2023 Therapeutic Exercise: NuStep L5 x (R) SLR in ER 2x10 Quadruped:  CARs CW/CCW Hydrants YTB x10 --> added leg extension YTB x10 Side Lying: Pilates leg kicks front/back 3-way glute med straight leg raises in IR (low, mid, high) x10 each Hip add leg lifts x10 --> added pulses x10  Figure 4 LTR                                                                                                                       PATIENT EDUCATION:  Education details: discontinue SI correction as part of HEP Person educated: Patient Education method: Explanation,  Education comprehension: verbalized understanding,  HOME EXERCISE PROGRAM: Access Code: [email or web address] URL: [email or web address] Date: 03/28/2023 Prepared by: Letitia Libra  Exercises - Bridge with Hip Abduction and Resistance  - 1 x daily - 7 x weekly - 3 sets - 10 reps - Single Leg  Bridge  - 1 x daily - 7 x weekly - 3 sets - 10 reps - Clamshell with Resistance  - 1 x daily - 7 x weekly - 3 sets - 10 reps - Sidelying Reverse Clamshell with Resistance  - 1 x daily - 7 x weekly - 3 sets - 10 reps - Side Plank on Knees  - 1 x daily - 7 x weekly - 3 sets - 3-5 reps - 5-10 sec hold - Quadruped Hip Abduction with Resistance Loop  - 1 x daily - 7 x weekly - 3 sets - 10 reps - The Diver  - 1 x daily - 7 x weekly - 3 sets - 10 reps - Single Leg Stance  - 1 x daily - 7 x weekly - 3 sets - 10 reps - Bird Dog with Resistance  - 1 x daily - 7 x weekly - 3 sets - 10 reps - Beginner Inside Leg Lift   - 1 x daily - 7 x weekly - 3 sets -  10 reps - Sidelying Hip Abduction  - 1 x daily - 7 x weekly - 3 sets - 10 reps - Sidelying Hip Abduction on Wall  - 1 x daily - 7 x weekly - 3 sets - 10 reps - Half Kneeling Hip Flexor Stretch  - 1 x daily - 7 x weekly - 3 sets - 30 sec  hold  ASSESSMENT:  CLINICAL IMPRESSION: Patient arrives with mild Rt hip pain. She is noted to have symmetrical pelvic alignment upon arrival. Further TPDN performed to gluteals with twitch response elicited and patient reporting abolishment of groin pain when walking in clinic post-intervention. Continued with gluteal and core strengthening with good tolerance. Verbal update to HEP for patient to discontinue SI joint correction with patient verbalizing understanding.   OBJECTIVE IMPAIRMENTS: decreased activity tolerance, decreased ROM, decreased strength, impaired flexibility, and pain.   ACTIVITY LIMITATIONS: sitting and standing  PARTICIPATION LIMITATIONS: driving, community activity, and occupation  PERSONAL FACTORS: Past/current experiences and Time since onset of injury/illness/exacerbation are also affecting patient's functional outcome.   REHAB POTENTIAL: Good  CLINICAL DECISION MAKING: Stable/uncomplicated  EVALUATION COMPLEXITY: Low   GOALS: Goals reviewed with patient? Yes  SHORT TERM GOALS: Target date: 03/01/2023  Pt will be independent with initial HEP Baseline: Goal status: MET  2.  Pt will tolerate 50% Rt hip IR ROM with pain <= 1/10 Baseline:  Goal status: MET    LONG TERM GOALS: Target date: 05/14/23  Pt will be independent with advanced HEP Baseline:  Goal status: ongoing   2.  Pt will improve FOTO to >= 64 to demo improved functional mobility Baseline:  Goal status: MET  3.  Pt will tolerate 75% ROM Rt hip IR and abd with pain <= 1/10 Baseline: 5/10 pain  Goal status: ongoing   4.  Pt will report getting up after sitting x 2 hours with pain <= 1/10 Baseline:  03/28/23: reports soreness when she stands after  sitting Goal status: MET  5.  Pt will improve Rt hip strength to 4+/5 Baseline:  Goal status: MET  6. Patient will maintain symmetrical pelvic alignment between sessions.   Baseline: see above  Goal status: NEW    PLAN:  PT FREQUENCY: 1x/week  PT DURATION: 6 weeks  PLANNED INTERVENTIONS: Therapeutic exercises, Therapeutic activity, Neuromuscular re-education, Balance training, Gait training, Patient/Family education, Self Care, Joint mobilization, Aquatic Therapy, Dry Needling, Electrical stimulation, Cryotherapy, Moist heat, Taping, Ultrasound, Ionotophoresis 4mg /ml Dexamethasone, Manual  therapy, and Re-evaluation  PLAN FOR NEXT SESSION: core/hip strengthening; hip mobility; TPDN as needed.    Letitia Libra, PT, DPT, ATC 04/06/23 8:46 AM

## 2023-04-18 ENCOUNTER — Ambulatory Visit: Payer: No Typology Code available for payment source | Attending: Family Medicine

## 2023-04-18 DIAGNOSIS — M25551 Pain in right hip: Secondary | ICD-10-CM | POA: Diagnosis present

## 2023-04-18 DIAGNOSIS — M6281 Muscle weakness (generalized): Secondary | ICD-10-CM

## 2023-04-18 DIAGNOSIS — R6889 Other general symptoms and signs: Secondary | ICD-10-CM | POA: Diagnosis present

## 2023-04-18 NOTE — Therapy (Signed)
OUTPATIENT PHYSICAL THERAPY LOWER EXTREMITY TREATMENT    Patient Name: Sylvia Meyer MRN: [id number] DOB:1970-06-29, 52 y.o., female Today's Date: 04/18/2023  END OF SESSION:  PT End of Session - 04/18/23 0759     Visit Number 11    Number of Visits 15    Date for PT Re-Evaluation 05/14/23    Authorization Type [employer]    Authorization Time Period 120 VISITS PER YEAR COMBINED WITH OT    PT Start Time 0800    PT Stop Time 0844    PT Time Calculation (min) 44 min    Activity Tolerance Patient tolerated treatment well    Behavior During Therapy WFL for tasks assessed/performed               Past Medical History:  Diagnosis Date   Allergy    Anemia    Anxiety    Depression    Family history of breast cancer    Family history of colon cancer    Family history of colon cancer    Family history of prostate cancer 12/22/2022   History reviewed. No pertinent surgical history. Patient Active Problem List   Diagnosis Date Noted   Genetic testing 12/30/2022   Family history of breast cancer 12/22/2022   Family history of colon cancer 12/22/2022   Family history of prostate cancer 12/22/2022   Large breasts 10/26/2017   Chronic venous insufficiency 10/19/2017   Varicose veins of bilateral lower extremities with other complications 10/19/2017   Poor posture 09/14/2017   Nonallopathic lesion of cervical region 09/14/2017   Slipped rib syndrome 09/14/2017   Nonallopathic lesion of rib cage 09/14/2017   Nonallopathic lesion of thoracic region 09/14/2017   Encounter for general adult medical examination with abnormal findings 08/30/2017   Allergic rhinitis 08/30/2017   Adjustment disorder with mixed anxiety and depressed mood 07/20/2015    PCP: Abbe Amsterdam  REFERRING PROVIDER: Abbe Amsterdam  REFERRING DIAG: Right hip pain  THERAPY DIAG:  Pain in right hip  Muscle weakness (generalized)  Decreased functional activity tolerance  Rationale for Evaluation and  Treatment: Rehabilitation  ONSET DATE: June 2024  SUBJECTIVE:   SUBJECTIVE STATEMENT: "Right now I am fine. Consistently the pain is right here (points to groin) when I am upright." She reports that the pain can fluctuate without known cause and can be either hip.    PERTINENT HISTORY: Multiple falls PAIN:  Are you having pain? Yes: NPRS scale: none currently; at worst 8/10 Pain location: bilateral hips Pain description: shooting, sharp  Aggravating factors: stairs; yard work Relieving factors: none reported  PRECAUTIONS: None  RED FLAGS: None   WEIGHT BEARING RESTRICTIONS: No  FALLS:  Has patient fallen in last 6 months? Yes. 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)  PLOF: Independent  PATIENT GOALS: decrease pain  NEXT MD VISIT: PRN  OBJECTIVE:   DIAGNOSTIC FINDINGS: x ray: OA bilat hips  PATIENT SURVEYS:  FOTO 47  03/28/23: 75% function  SENSATION: WFL  MUSCLE LENGTH: Hamstrings: Right 108 deg; Left 108 deg Thomas test: negative bilat - Rt hip/sacrum pain with thomas test position  PALPATION: Hip jt mobility WFL lateral glides, inferior glides, anterior glides TTP RT great trochanter, Rt SIJ lateral border Increased mm spasticity Rt glutes  03/28/23: Rt ASIS and iliac crest elevated in standing  04/06/23: symmetrical pelvic alignment  LUMBAR ROM:   Active  A/PROM  03/28/23  Flexion Full   Extension WFL Tightness in anterior hips  Right lateral flexion WFL pinch Rt SIJ  Left lateral flexion WFL  Right rotation [facility]  Left rotation WFL   (Blank rows = not tested)  LOWER EXTREMITY ROM:  Active ROM Right eval Left eval 03/14/23 03/28/23  Hip flexion WFL pain WFL  WFL bilateral   Hip extension      Hip abduction 75% pain 75%  25% limited Rt ; pain Rt 5/10  Hip adduction      Hip internal rotation 25% pain 25% Lt: Full and pain free passive; Rt 25% limited pain passive 4/10 pain (post manual 1/10 pain 50%  limited) 25% limited Rt 3/10 pn on Rt  Hip external rotation 75% 75% Lt: Full and pain free passive; Rt: 25% limited passive 4/10; (post manual no pain and full range)  WFL bilateral 1/0 pn on Rt   Knee flexion      Knee extension      Ankle dorsiflexion      Ankle plantarflexion      Ankle inversion      Ankle eversion       (Blank rows = not tested)  LOWER EXTREMITY MMT:  MMT Right eval Left eval 03/28/23  Hip flexion 4 4 5  bilateral   Hip extension 3- 4 5 bilateral   Hip abduction 4  5 bilateral   Hip adduction     Hip internal rotation   5 bilateral   Hip external rotation 4  5 bilateral  Knee flexion     Knee extension     Ankle dorsiflexion     Ankle plantarflexion     Ankle inversion     Ankle eversion      (Blank rows = not tested)  SPECIAL TESTS: 03/28/23:   (+) Long Sit (+) Thigh Thrust (-) FABER, sacral thrust, Gaenslen's    TODAY'S TREATMENT:         [facility] Adult PT Treatment:                                                DATE: 04/18/23 Therapeutic Exercise: Recumbent bike level 1 x 5 minutes Lunge hip flexor stretch x 1 min   Pigeon pose x 30 sec each  Figure 4 stretch x 30 sec each  Side sitting mobilization x 10  Supine TA march 2 x 10  90/90 toe taps 2 x 10  Prone pressup x 10  Bird dog 2 x 10  Dead bug 2 x 10  Updated HEP   Self Care: Positional changes throughout the day    [facility] Adult PT Treatment:                                                DATE: 04/06/23 Therapeutic Exercise: Rt glute stretch x 30 sec Butterfly stretch x 30 sec  Donkey kicks 2 x 10  Lateral band walks blue band at shins 2 x 10 ft d/b 3 way hip x 10 each  Supine TA march 2 x 10  90/90 toe tap 2 x 10  Sidelying hip circles 2 x 10; CW/CCW Swimmers 2 x 10  Manual Therapy: Skilled palpation of trigger points  Trigger Point Dry Needling Treatment: Pre-treatment instruction: Patient instructed on dry needling rationale,  procedures, and possible side effects including  pain during treatment (achy,cramping feeling), bruising, drop of blood, lightheadedness, nausea, sweating. Patient Consent Given: Yes Education handout provided: Previously provided Muscles treated: Rt gluteals   Treatment response/outcome: Twitch response elicited and Palpable decrease in muscle tension Post-treatment instructions: Patient instructed to expect possible mild to moderate muscle soreness later today and/or tomorrow. Patient instructed in methods to reduce muscle soreness and to continue prescribed HEP. If patient was dry needled over the lung field, patient was instructed on signs and symptoms of pneumothorax and, however unlikely, to see immediate medical attention should they occur. Patient was also educated on signs and symptoms of infection and to seek medical attention should they occur. Patient verbalized understanding of these instructions and education.   [facility] Adult PT Treatment:                                                DATE: 03/28/23 Therapeutic Exercise: Reviewed and updated HEP Hip flexor stretch x 30 sec each  Figure hydrant x 5 each  Manual Therapy: Muscle energy technique to improve pelvic alignment  STM Rt gluteals Trigger Point Dry Needling Treatment: Pre-treatment instruction: Patient instructed on dry needling rationale, procedures, and possible side effects including pain during treatment (achy,cramping feeling), bruising, drop of blood, lightheadedness, nausea, sweating. Patient Consent Given: Yes Education handout provided: Yes Muscles treated: Rt gluteals   Treatment response/outcome: Twitch response elicited and Palpable decrease in muscle tension Post-treatment instructions: Patient instructed to expect possible mild to moderate muscle soreness later today and/or tomorrow. Patient instructed in methods to reduce muscle soreness and to continue prescribed HEP. If patient was dry needled over the lung field, patient was instructed on signs and symptoms  of pneumothorax and, however unlikely, to see immediate medical attention should they occur. Patient was also educated on signs and symptoms of infection and to seek medical attention should they occur. Patient verbalized understanding of these instructions and education. Therapeutic Activity: Re-assessment to determine overall progress, educating patient on progress towards goals.                                                                                                                       PATIENT EDUCATION:  Education details: HEP update Person educated: Patient Education method: Explanation, demo, cues, handout Education comprehension: verbalized understanding, returned demo, cues   HOME EXERCISE PROGRAM: Access Code: [email or web address] URL: [email or web address] Date: 04/18/2023 Prepared by: Letitia Libra  Exercises - Half Kneeling Hip Flexor Stretch  - 1 x daily - 7 x weekly - 3 sets - 30 sec  hold - Pigeon Pose  - 1 x daily - 7 x weekly - 3 sets - 30 sec  hold - Supine Figure 4 Piriformis Stretch  - 2 x daily - 7 x weekly - 3 sets - 30  hold - Side Sitting  -  1 x daily - 7 x weekly - 1 sets - 10 reps - Prone Press Up  - 1 x daily - 7 x weekly - 1 sets - 10 reps - Bridge with Hip Abduction and Resistance  - 1 x daily - 7 x weekly - 3 sets - 10 reps - Single Leg Bridge  - 1 x daily - 7 x weekly - 3 sets - 10 reps - Clamshell with Resistance  - 1 x daily - 7 x weekly - 3 sets - 10 reps - Sidelying Reverse Clamshell with Resistance  - 1 x daily - 7 x weekly - 3 sets - 10 reps - Side Plank on Knees  - 1 x daily - 7 x weekly - 3 sets - 3-5 reps - 5-10 sec hold - Quadruped Hip Abduction with Resistance Loop  - 1 x daily - 7 x weekly - 3 sets - 10 reps - The Diver  - 1 x daily - 7 x weekly - 3 sets - 10 reps - Single Leg Stance  - 1 x daily - 7 x weekly - 3 sets - 10 reps - Bird Dog with Resistance  - 1 x daily - 7 x weekly - 3 sets - 10 reps - Beginner Inside Leg Lift   - 1  x daily - 7 x weekly - 3 sets - 10 reps - Sidelying Hip Abduction  - 1 x daily - 7 x weekly - 3 sets - 10 reps - Sidelying Hip Abduction on Wall  - 1 x daily - 7 x weekly - 3 sets - 10 reps  ASSESSMENT:  CLINICAL IMPRESSION: Patient arrives without reports of hip pain, though her pain does continue to fluctuate without known cause. We focused on updating HEP to include hip stretching and progressed dynamic core stabilization with good tolerance. No reports of hip pain throughout session. Discussed importance of frequent mobility throughout her day with recommendations to set timer to change positions while completing desk work with patient verbalizing understanding.   OBJECTIVE IMPAIRMENTS: decreased activity tolerance, decreased ROM, decreased strength, impaired flexibility, and pain.   ACTIVITY LIMITATIONS: sitting and standing  PARTICIPATION LIMITATIONS: driving, community activity, and occupation  PERSONAL FACTORS: Past/current experiences and Time since onset of injury/illness/exacerbation are also affecting patient's functional outcome.   REHAB POTENTIAL: Good  CLINICAL DECISION MAKING: Stable/uncomplicated  EVALUATION COMPLEXITY: Low   GOALS: Goals reviewed with patient? Yes  SHORT TERM GOALS: Target date: 03/01/2023  Pt will be independent with initial HEP Baseline: Goal status: MET  2.  Pt will tolerate 50% Rt hip IR ROM with pain <= 1/10 Baseline:  Goal status: MET    LONG TERM GOALS: Target date: 05/14/23  Pt will be independent with advanced HEP Baseline:  Goal status: ongoing   2.  Pt will improve FOTO to >= 64 to demo improved functional mobility Baseline:  Goal status: MET  3.  Pt will tolerate 75% ROM Rt hip IR and abd with pain <= 1/10 Baseline: 5/10 pain  Goal status: ongoing   4.  Pt will report getting up after sitting x 2 hours with pain <= 1/10 Baseline:  03/28/23: reports soreness when she stands after sitting Goal status: MET  5.  Pt  will improve Rt hip strength to 4+/5 Baseline:  Goal status: MET  6. Patient will maintain symmetrical pelvic alignment between sessions.   Baseline: see above  Goal status: NEW    PLAN:  PT FREQUENCY: 1x/week  PT  DURATION: 6 weeks  PLANNED INTERVENTIONS: Therapeutic exercises, Therapeutic activity, Neuromuscular re-education, Balance training, Gait training, Patient/Family education, Self Care, Joint mobilization, Aquatic Therapy, Dry Needling, Electrical stimulation, Cryotherapy, Moist heat, Taping, Ultrasound, Ionotophoresis 4mg /ml Dexamethasone, Manual therapy, and Re-evaluation  PLAN FOR NEXT SESSION: core/hip strengthening; hip mobility; TPDN as needed.    Letitia Libra, PT, DPT, ATC 04/18/23 8:45 AM

## 2023-04-22 ENCOUNTER — Telehealth: Payer: No Typology Code available for payment source | Admitting: Nurse Practitioner

## 2023-04-22 DIAGNOSIS — R3989 Other symptoms and signs involving the genitourinary system: Secondary | ICD-10-CM | POA: Diagnosis not present

## 2023-04-22 MED ORDER — CEPHALEXIN 500 MG PO CAPS: 500.0000 mg | ORAL_CAPSULE | Freq: Two times a day (BID) | ORAL | 0 refills | Status: AC

## 2023-04-22 NOTE — Progress Notes (Signed)
Virtual Visit Consent   Sylvia Meyer, you are scheduled for a virtual visit with a [facility] provider today. Just as with appointments in the office, your consent must be obtained to participate. Your consent will be active for this visit and any virtual visit you may have with one of our providers in the next 365 days. If you have a MyChart account, a copy of this consent can be sent to you electronically.  As this is a virtual visit, video technology does not allow for your provider to perform a traditional examination. This may limit your provider's ability to fully assess your condition. If your provider identifies any concerns that need to be evaluated in person or the need to arrange testing (such as labs, EKG, etc.), we will make arrangements to do so. Although advances in technology are sophisticated, we cannot ensure that it will always work on either your end or our end. If the connection with a video visit is poor, the visit may have to be switched to a telephone visit. With either a video or telephone visit, we are not always able to ensure that we have a secure connection.  By engaging in this virtual visit, you consent to the provision of healthcare and authorize for your insurance to be billed (if applicable) for the services provided during this visit. Depending on your insurance coverage, you may receive a charge related to this service.  I need to obtain your verbal consent now. Are you willing to proceed with your visit today? DIONICIA THWAITS has provided verbal consent on 04/22/2023 for a virtual visit (video or telephone). Viviano Simas, FNP  Date: 04/22/2023 9:41 AM  Virtual Visit via Video Note   I, Viviano Simas, connected with  Sylvia Meyer  ([id number], 1971/04/09) on 04/22/23 at  9:45 AM EST by a video-enabled telemedicine application and verified that I am speaking with the correct person using two identifiers.  Location: Patient: Virtual Visit Location Patient:  Home Provider: Virtual Visit Location Provider: Home Office   I discussed the limitations of evaluation and management by telemedicine and the availability of in person appointments. The patient expressed understanding and agreed to proceed.    History of Present Illness: Sylvia Meyer is a 52 y.o. who identifies as a female who was assigned female at birth, and is being seen today for constant pressure and need to urinate  Some discomfort with urination as well   She has not had a UTI recently    She has not had a period in the past year, just entering menopause   Denies any vaginal symptoms today   Problems:  Patient Active Problem List   Diagnosis Date Noted   Genetic testing 12/30/2022   Family history of breast cancer 12/22/2022   Family history of colon cancer 12/22/2022   Family history of prostate cancer 12/22/2022   Large breasts 10/26/2017   Chronic venous insufficiency 10/19/2017   Varicose veins of bilateral lower extremities with other complications 10/19/2017   Poor posture 09/14/2017   Nonallopathic lesion of cervical region 09/14/2017   Slipped rib syndrome 09/14/2017   Nonallopathic lesion of rib cage 09/14/2017   Nonallopathic lesion of thoracic region 09/14/2017   Encounter for general adult medical examination with abnormal findings 08/30/2017   Allergic rhinitis 08/30/2017   Adjustment disorder with mixed anxiety and depressed mood 07/20/2015    Allergies: No Known Allergies Medications:  Current Outpatient Medications:    buPROPion (WELLBUTRIN XL) 150 MG 24  hr tablet, TAKE 1 TABLET BY MOUTH EVERY DAY, Disp: 90 tablet, Rfl: 3   cyclobenzaprine (FLEXERIL) 5 MG tablet, Take 0.5 tablets (2.5 mg total) by mouth at bedtime as needed for muscle spasms., Disp: 20 tablet, Rfl: 0   estradiol (VIVELLE-DOT) 0.05 MG/24HR patch, Place 1 patch (0.05 mg total) onto the skin 2 (two) times a week., Disp: 24 patch, Rfl: 3   Multiple Vitamins-Minerals (MULTIVITAL PO),  Take by mouth., Disp: , Rfl:    OVER THE COUNTER MEDICATION, , Disp: , Rfl:    OVER THE COUNTER MEDICATION, , Disp: , Rfl:    progesterone (PROMETRIUM) 100 MG capsule, Take 1 capsule (100 mg total) by mouth at bedtime., Disp: 90 capsule, Rfl: 3   Vitamin D, Ergocalciferol, (DRISDOL) 1.25 MG (50000 UNIT) CAPS capsule, Take 1 capsule by mouth every 7 days. (Patient not taking: Reported on 01/31/2023), Disp: 12 capsule, Rfl: 0  Observations/Objective: Patient is well-developed, well-nourished in no acute distress.  Resting comfortably  at home.  Head is normocephalic, atraumatic.  No labored breathing.  Speech is clear and coherent with logical content.  Patient is alert and oriented at baseline.    Assessment and Plan:  1. Suspected UTI  - cephALEXin (KEFLEX) 500 MG capsule; Take 1 capsule (500 mg total) by mouth 2 (two) times daily for 7 days.  Dispense: 14 capsule; Refill: 0    Push fluids and rest  Education sent   Follow Up Instructions: I discussed the assessment and treatment plan with the patient. The patient was provided an opportunity to ask questions and all were answered. The patient agreed with the plan and demonstrated an understanding of the instructions.  A copy of instructions were sent to the patient via MyChart unless otherwise noted below.    The patient was advised to call back or seek an in-person evaluation if the symptoms worsen or if the condition fails to improve as anticipated.    Viviano Simas, FNP

## 2023-04-25 ENCOUNTER — Ambulatory Visit: Payer: No Typology Code available for payment source

## 2023-04-25 DIAGNOSIS — M25551 Pain in right hip: Secondary | ICD-10-CM

## 2023-04-25 DIAGNOSIS — M6281 Muscle weakness (generalized): Secondary | ICD-10-CM

## 2023-04-25 DIAGNOSIS — R6889 Other general symptoms and signs: Secondary | ICD-10-CM

## 2023-04-25 NOTE — Therapy (Signed)
OUTPATIENT PHYSICAL THERAPY LOWER EXTREMITY TREATMENT    Patient Name: Sylvia Meyer MRN: [id number] DOB:1970-07-27, 52 y.o., female Today's Date: 04/25/2023  END OF SESSION:  PT End of Session - 04/25/23 0802     Visit Number 12    Number of Visits 15    Date for PT Re-Evaluation 05/14/23    Authorization Type [employer]    Authorization Time Period 120 VISITS PER YEAR COMBINED WITH OT    PT Start Time 0802    PT Stop Time 0841    PT Time Calculation (min) 39 min    Activity Tolerance Patient tolerated treatment well    Behavior During Therapy WFL for tasks assessed/performed                Past Medical History:  Diagnosis Date   Allergy    Anemia    Anxiety    Depression    Family history of breast cancer    Family history of colon cancer    Family history of colon cancer    Family history of prostate cancer 12/22/2022   History reviewed. No pertinent surgical history. Patient Active Problem List   Diagnosis Date Noted   Genetic testing 12/30/2022   Family history of breast cancer 12/22/2022   Family history of colon cancer 12/22/2022   Family history of prostate cancer 12/22/2022   Large breasts 10/26/2017   Chronic venous insufficiency 10/19/2017   Varicose veins of bilateral lower extremities with other complications 10/19/2017   Poor posture 09/14/2017   Nonallopathic lesion of cervical region 09/14/2017   Slipped rib syndrome 09/14/2017   Nonallopathic lesion of rib cage 09/14/2017   Nonallopathic lesion of thoracic region 09/14/2017   Encounter for general adult medical examination with abnormal findings 08/30/2017   Allergic rhinitis 08/30/2017   Adjustment disorder with mixed anxiety and depressed mood 07/20/2015    PCP: Abbe Amsterdam  REFERRING PROVIDER: Abbe Amsterdam  REFERRING DIAG: Right hip pain  THERAPY DIAG:  Pain in right hip  Muscle weakness (generalized)  Decreased functional activity tolerance  Rationale for Evaluation  and Treatment: Rehabilitation  ONSET DATE: June 2024  SUBJECTIVE:   SUBJECTIVE STATEMENT: "I feel good. If I do some stretches and exercises I feel good."    PERTINENT HISTORY: Multiple falls PAIN:  Are you having pain? Yes: NPRS scale: none currently; at worst 8/10 Pain location: bilateral hips Pain description: shooting, sharp  Aggravating factors: stairs; yard work Relieving factors: none reported  PRECAUTIONS: None  RED FLAGS: None   WEIGHT BEARING RESTRICTIONS: No  FALLS:  Has patient fallen in last 6 months? Yes. 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)  PLOF: Independent  PATIENT GOALS: decrease pain  NEXT MD VISIT: PRN  OBJECTIVE:   DIAGNOSTIC FINDINGS: x ray: OA bilat hips  PATIENT SURVEYS:  FOTO 47  03/28/23: 75% function  SENSATION: WFL  MUSCLE LENGTH: Hamstrings: Right 108 deg; Left 108 deg Thomas test: negative bilat - Rt hip/sacrum pain with thomas test position  PALPATION: Hip jt mobility WFL lateral glides, inferior glides, anterior glides TTP RT great trochanter, Rt SIJ lateral border Increased mm spasticity Rt glutes  03/28/23: Rt ASIS and iliac crest elevated in standing  04/06/23: symmetrical pelvic alignment  LUMBAR ROM:   Active  A/PROM  03/28/23  Flexion Full   Extension WFL Tightness in anterior hips  Right lateral flexion WFL pinch Rt SIJ  Left lateral flexion WFL  Right rotation [facility]  Left  rotation WFL   (Blank rows = not tested)  LOWER EXTREMITY ROM:  Active ROM Right eval Left eval 03/14/23 03/28/23  Hip flexion WFL pain [facility]  WFL bilateral   Hip extension      Hip abduction 75% pain 75%  25% limited Rt ; pain Rt 5/10  Hip adduction      Hip internal rotation 25% pain 25% Lt: Full and pain free passive; Rt 25% limited pain passive 4/10 pain (post manual 1/10 pain 50% limited) 25% limited Rt 3/10 pn on Rt  Hip external rotation 75% 75% Lt: Full and pain free passive; Rt: 25%  limited passive 4/10; (post manual no pain and full range)  WFL bilateral 1/0 pn on Rt   Knee flexion      Knee extension      Ankle dorsiflexion      Ankle plantarflexion      Ankle inversion      Ankle eversion       (Blank rows = not tested)  LOWER EXTREMITY MMT:  MMT Right eval Left eval 03/28/23  Hip flexion 4 4 5  bilateral   Hip extension 3- 4 5 bilateral   Hip abduction 4  5 bilateral   Hip adduction     Hip internal rotation   5 bilateral   Hip external rotation 4  5 bilateral  Knee flexion     Knee extension     Ankle dorsiflexion     Ankle plantarflexion     Ankle inversion     Ankle eversion      (Blank rows = not tested)  SPECIAL TESTS: 03/28/23:   (+) Long Sit (+) Thigh Thrust (-) FABER, sacral thrust, Gaenslen's    TODAY'S TREATMENT:        [facility] Adult PT Treatment:                                                DATE: 04/25/23 Therapeutic Exercise: Recumbent bike level 2 x 5 minutes  LTR with figure 4 x 1 minute each  Lunge hip flexor stretch x 1 minute each  Pelvic tilts on physioball 2 x 10  Seated march on physioball 2 x 10  Seated knee extension on physioball 2 x 10  3 way hip airex pad x 10 each  Standing clamshell 2 x 10 Leg press with black band at thighs 2 x 10 @ 65 lbs  Updated HEP    [facility] Adult PT Treatment:                                                DATE: 04/18/23 Therapeutic Exercise: Recumbent bike level 1 x 5 minutes Lunge hip flexor stretch x 1 min   Pigeon pose x 30 sec each  Figure 4 stretch x 30 sec each  Side sitting mobilization x 10  Supine TA march 2 x 10  90/90 toe taps 2 x 10  Prone pressup x 10  Bird dog 2 x 10  Dead bug 2 x 10  Updated HEP   Self Care: Positional changes throughout the day    [facility] Adult PT Treatment:  DATE: 04/06/23 Therapeutic Exercise: Rt glute stretch x 30 sec Butterfly stretch x 30 sec  Donkey kicks 2 x 10  Lateral band walks blue band at  shins 2 x 10 ft d/b 3 way hip x 10 each  Supine TA march 2 x 10  90/90 toe tap 2 x 10  Sidelying hip circles 2 x 10; CW/CCW Swimmers 2 x 10  Manual Therapy: Skilled palpation of trigger points  Trigger Point Dry Needling Treatment: Pre-treatment instruction: Patient instructed on dry needling rationale, procedures, and possible side effects including pain during treatment (achy,cramping feeling), bruising, drop of blood, lightheadedness, nausea, sweating. Patient Consent Given: Yes Education handout provided: Previously provided Muscles treated: Rt gluteals   Treatment response/outcome: Twitch response elicited and Palpable decrease in muscle tension Post-treatment instructions: Patient instructed to expect possible mild to moderate muscle soreness later today and/or tomorrow. Patient instructed in methods to reduce muscle soreness and to continue prescribed HEP. If patient was dry needled over the lung field, patient was instructed on signs and symptoms of pneumothorax and, however unlikely, to see immediate medical attention should they occur. Patient was also educated on signs and symptoms of infection and to seek medical attention should they occur. Patient verbalized understanding of these instructions and education.                                                                                                                       PATIENT EDUCATION:  Education details: HEP update Person educated: Patient Education method: Explanation, demo, cues, handout Education comprehension: verbalized understanding, returned demo, cues   HOME EXERCISE PROGRAM: Access Code: [email or web address] URL: [email or web address] Date: 04/25/2023 Prepared by: Letitia Libra  Exercises - Half Kneeling Hip Flexor Stretch  - 1 x daily - 7 x weekly - 3 sets - 30 sec  hold - Pigeon Pose  - 1 x daily - 7 x weekly - 3 sets - 30 sec  hold - Supine Figure 4 Piriformis Stretch  - 2 x daily - 7 x weekly - 3  sets - 30  hold - Side Sitting  - 1 x daily - 7 x weekly - 1 sets - 10 reps - Prone Press Up  - 1 x daily - 7 x weekly - 1 sets - 10 reps - Bridge with Hip Abduction and Resistance  - 1 x daily - 7 x weekly - 3 sets - 10 reps - Single Leg Bridge  - 1 x daily - 7 x weekly - 3 sets - 10 reps - Clamshell with Resistance  - 1 x daily - 7 x weekly - 3 sets - 10 reps - Sidelying Reverse Clamshell with Resistance  - 1 x daily - 7 x weekly - 3 sets - 10 reps - Side Plank on Knees  - 1 x daily - 7 x weekly - 3 sets - 3-5 reps - 5-10 sec hold - Quadruped Hip Abduction with Resistance Loop  - 1 x daily - 7 x weekly -  3 sets - 10 reps - The Diver  - 1 x daily - 7 x weekly - 3 sets - 10 reps - Single Leg Stance  - 1 x daily - 7 x weekly - 3 sets - 10 reps - Bird Dog with Resistance  - 1 x daily - 7 x weekly - 3 sets - 10 reps - Beginner Inside Leg Lift   - 1 x daily - 7 x weekly - 3 sets - 10 reps - Sidelying Hip Abduction  - 1 x daily - 7 x weekly - 3 sets - 10 reps - Sidelying Hip Abduction on Wall  - 1 x daily - 7 x weekly - 3 sets - 10 reps - Swiss Ball March  - 1 x daily - 7 x weekly - 2 sets - 10 reps - Swiss Ball Knee Extension  - 1 x daily - 7 x weekly - 2 sets - 10 reps  ASSESSMENT:  CLINICAL IMPRESSION: Incorporated physioball with dynamic core stabilization today providing good challenge for patient without onset of pain. Progressed standing hip strengthening with patient demonstrating good lumbopelvic stability, but quickly fatigues. No reports of hip pain throughout session.   OBJECTIVE IMPAIRMENTS: decreased activity tolerance, decreased ROM, decreased strength, impaired flexibility, and pain.   ACTIVITY LIMITATIONS: sitting and standing  PARTICIPATION LIMITATIONS: driving, community activity, and occupation  PERSONAL FACTORS: Past/current experiences and Time since onset of injury/illness/exacerbation are also affecting patient's functional outcome.   REHAB POTENTIAL:  Good  CLINICAL DECISION MAKING: Stable/uncomplicated  EVALUATION COMPLEXITY: Low   GOALS: Goals reviewed with patient? Yes  SHORT TERM GOALS: Target date: 03/01/2023  Pt will be independent with initial HEP Baseline: Goal status: MET  2.  Pt will tolerate 50% Rt hip IR ROM with pain <= 1/10 Baseline:  Goal status: MET    LONG TERM GOALS: Target date: 05/14/23  Pt will be independent with advanced HEP Baseline:  Goal status: ongoing   2.  Pt will improve FOTO to >= 64 to demo improved functional mobility Baseline:  Goal status: MET  3.  Pt will tolerate 75% ROM Rt hip IR and abd with pain <= 1/10 Baseline: 5/10 pain  Goal status: ongoing   4.  Pt will report getting up after sitting x 2 hours with pain <= 1/10 Baseline:  03/28/23: reports soreness when she stands after sitting Goal status: MET  5.  Pt will improve Rt hip strength to 4+/5 Baseline:  Goal status: MET  6. Patient will maintain symmetrical pelvic alignment between sessions.   Baseline: see above  Goal status: NEW    PLAN:  PT FREQUENCY: 1x/week  PT DURATION: 6 weeks  PLANNED INTERVENTIONS: Therapeutic exercises, Therapeutic activity, Neuromuscular re-education, Balance training, Gait training, Patient/Family education, Self Care, Joint mobilization, Aquatic Therapy, Dry Needling, Electrical stimulation, Cryotherapy, Moist heat, Taping, Ultrasound, Ionotophoresis 4mg /ml Dexamethasone, Manual therapy, and Re-evaluation  PLAN FOR NEXT SESSION: core/hip strengthening; hip mobility; TPDN as needed.    Letitia Libra, PT, DPT, ATC 04/25/23 8:43 AM

## 2023-05-02 ENCOUNTER — Ambulatory Visit: Payer: No Typology Code available for payment source

## 2023-05-02 DIAGNOSIS — M25551 Pain in right hip: Secondary | ICD-10-CM | POA: Diagnosis not present

## 2023-05-02 DIAGNOSIS — M6281 Muscle weakness (generalized): Secondary | ICD-10-CM

## 2023-05-02 DIAGNOSIS — R6889 Other general symptoms and signs: Secondary | ICD-10-CM

## 2023-05-02 NOTE — Therapy (Signed)
OUTPATIENT PHYSICAL THERAPY LOWER EXTREMITY TREATMENT    Patient Name: Sylvia Meyer MRN: [id number] DOB:1971/04/25, 52 y.o., female Today's Date: 05/02/2023  END OF SESSION:  PT End of Session - 05/02/23 0801     Visit Number 13    Number of Visits 15    Date for PT Re-Evaluation 05/14/23    Authorization Type [employer]    Authorization Time Period 120 VISITS PER YEAR COMBINED WITH OT    PT Start Time 0801    PT Stop Time 0842    PT Time Calculation (min) 41 min    Activity Tolerance Patient tolerated treatment well    Behavior During Therapy WFL for tasks assessed/performed                 Past Medical History:  Diagnosis Date   Allergy    Anemia    Anxiety    Depression    Family history of breast cancer    Family history of colon cancer    Family history of colon cancer    Family history of prostate cancer 12/22/2022   History reviewed. No pertinent surgical history. Patient Active Problem List   Diagnosis Date Noted   Genetic testing 12/30/2022   Family history of breast cancer 12/22/2022   Family history of colon cancer 12/22/2022   Family history of prostate cancer 12/22/2022   Large breasts 10/26/2017   Chronic venous insufficiency 10/19/2017   Varicose veins of bilateral lower extremities with other complications 10/19/2017   Poor posture 09/14/2017   Nonallopathic lesion of cervical region 09/14/2017   Slipped rib syndrome 09/14/2017   Nonallopathic lesion of rib cage 09/14/2017   Nonallopathic lesion of thoracic region 09/14/2017   Encounter for general adult medical examination with abnormal findings 08/30/2017   Allergic rhinitis 08/30/2017   Adjustment disorder with mixed anxiety and depressed mood 07/20/2015    PCP: Abbe Amsterdam  REFERRING PROVIDER: Abbe Amsterdam  REFERRING DIAG: Right hip pain  THERAPY DIAG:  Pain in right hip  Muscle weakness (generalized)  Decreased functional activity tolerance  Rationale for  Evaluation and Treatment: Rehabilitation  ONSET DATE: June 2024  SUBJECTIVE:   SUBJECTIVE STATEMENT: "I have to stretch and do exercises every day or I will be stiff and sore."   PERTINENT HISTORY: Multiple falls PAIN:  Are you having pain? Yes: NPRS scale: 1/10 Pain location: bilateral hips Pain description: sore,stiff  Aggravating factors: stairs; yard work Relieving factors: none reported  PRECAUTIONS: None  RED FLAGS: None   WEIGHT BEARING RESTRICTIONS: No  FALLS:  Has patient fallen in last 6 months? Yes. 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)  PLOF: Independent  PATIENT GOALS: decrease pain  NEXT MD VISIT: PRN  OBJECTIVE:   DIAGNOSTIC FINDINGS: x ray: OA bilat hips  PATIENT SURVEYS:  FOTO 47  03/28/23: 75% function  SENSATION: WFL  MUSCLE LENGTH: Hamstrings: Right 108 deg; Left 108 deg Thomas test: negative bilat - Rt hip/sacrum pain with thomas test position  PALPATION: Hip jt mobility WFL lateral glides, inferior glides, anterior glides TTP RT great trochanter, Rt SIJ lateral border Increased mm spasticity Rt glutes  03/28/23: Rt ASIS and iliac crest elevated in standing  04/06/23: symmetrical pelvic alignment  LUMBAR ROM:   Active  A/PROM  03/28/23  Flexion Full   Extension WFL Tightness in anterior hips  Right lateral flexion WFL pinch Rt SIJ  Left lateral flexion WFL  Right rotation WFL  Left rotation WFL   (  Blank rows = not tested)  LOWER EXTREMITY ROM:  Active ROM Right eval Left eval 03/14/23 03/28/23  Hip flexion WFL pain [facility]  WFL bilateral   Hip extension      Hip abduction 75% pain 75%  25% limited Rt ; pain Rt 5/10  Hip adduction      Hip internal rotation 25% pain 25% Lt: Full and pain free passive; Rt 25% limited pain passive 4/10 pain (post manual 1/10 pain 50% limited) 25% limited Rt 3/10 pn on Rt  Hip external rotation 75% 75% Lt: Full and pain free passive; Rt: 25% limited  passive 4/10; (post manual no pain and full range)  WFL bilateral 1/0 pn on Rt   Knee flexion      Knee extension      Ankle dorsiflexion      Ankle plantarflexion      Ankle inversion      Ankle eversion       (Blank rows = not tested)  LOWER EXTREMITY MMT:  MMT Right eval Left eval 03/28/23  Hip flexion 4 4 5  bilateral   Hip extension 3- 4 5 bilateral   Hip abduction 4  5 bilateral   Hip adduction     Hip internal rotation   5 bilateral   Hip external rotation 4  5 bilateral  Knee flexion     Knee extension     Ankle dorsiflexion     Ankle plantarflexion     Ankle inversion     Ankle eversion      (Blank rows = not tested)  SPECIAL TESTS: 03/28/23:   (+) Long Sit (+) Thigh Thrust (-) FABER, sacral thrust, Gaenslen's    TODAY'S TREATMENT:         [facility] Adult PT Treatment:                                                DATE: 05/02/23 Therapeutic Exercise: Recumbent bike level 2 x 5 minutes  Dynamic stretching: frankenstein's, walking quad stretch, walking hamstring stretch, rotational step overs fwd/bwd, walking figure 4  Squats x 10  Walking lunge 2 x 10 ft  Standing clamshells x 10 each  Lateral,forward,backward band walks 2 x 10 ft; black band shins  Leg press 2 x 10 @ 65 lbs  HEP update    [facility] Adult PT Treatment:                                                DATE: 04/25/23 Therapeutic Exercise: Recumbent bike level 2 x 5 minutes  LTR with figure 4 x 1 minute each  Lunge hip flexor stretch x 1 minute each  Pelvic tilts on physioball 2 x 10  Seated march on physioball 2 x 10  Seated knee extension on physioball 2 x 10  3 way hip airex pad x 10 each  Standing clamshell 2 x 10 Leg press with black band at thighs 2 x 10 @ 65 lbs  Updated HEP    [facility] Adult PT Treatment:  DATE: 04/18/23 Therapeutic Exercise: Recumbent bike level 1 x 5 minutes Lunge hip flexor stretch x 1 min   Pigeon pose x 30 sec each   Figure 4 stretch x 30 sec each  Side sitting mobilization x 10  Supine TA march 2 x 10  90/90 toe taps 2 x 10  Prone pressup x 10  Bird dog 2 x 10  Dead bug 2 x 10  Updated HEP   Self Care: Positional changes throughout the day    [facility] Adult PT Treatment:                                                DATE: 04/06/23 Therapeutic Exercise: Rt glute stretch x 30 sec Butterfly stretch x 30 sec  Donkey kicks 2 x 10  Lateral band walks blue band at shins 2 x 10 ft d/b 3 way hip x 10 each  Supine TA march 2 x 10  90/90 toe tap 2 x 10  Sidelying hip circles 2 x 10; CW/CCW Swimmers 2 x 10  Manual Therapy: Skilled palpation of trigger points  Trigger Point Dry Needling Treatment: Pre-treatment instruction: Patient instructed on dry needling rationale, procedures, and possible side effects including pain during treatment (achy,cramping feeling), bruising, drop of blood, lightheadedness, nausea, sweating. Patient Consent Given: Yes Education handout provided: Previously provided Muscles treated: Rt gluteals   Treatment response/outcome: Twitch response elicited and Palpable decrease in muscle tension Post-treatment instructions: Patient instructed to expect possible mild to moderate muscle soreness later today and/or tomorrow. Patient instructed in methods to reduce muscle soreness and to continue prescribed HEP. If patient was dry needled over the lung field, patient was instructed on signs and symptoms of pneumothorax and, however unlikely, to see immediate medical attention should they occur. Patient was also educated on signs and symptoms of infection and to seek medical attention should they occur. Patient verbalized understanding of these instructions and education.                                                                                                                       PATIENT EDUCATION:  Education details: HEP update Person educated: Patient Education method:  Explanation, demo, cues, handout Education comprehension: verbalized understanding, returned demo, cues   HOME EXERCISE PROGRAM: Access Code: [email or web address] URL: [email or web address] Date: 05/02/2023 Prepared by: Letitia Libra  Program Notes PICK 4-5 DAILY  Exercises - Half Kneeling Hip Flexor Stretch  - 1 x daily - 7 x weekly - 3 sets - 30 sec  hold - Pigeon Pose  - 1 x daily - 7 x weekly - 3 sets - 30 sec  hold - Supine Figure 4 Piriformis Stretch  - 2 x daily - 7 x weekly - 3 sets - 30  hold - Side Sitting  - 1 x daily - 7 x weekly - 1 sets -  10 reps - Prone Press Up  - 1 x daily - 7 x weekly - 1 sets - 10 reps - Bridge with Hip Abduction and Resistance  - 1 x daily - 7 x weekly - 3 sets - 10 reps - Single Leg Bridge  - 1 x daily - 7 x weekly - 3 sets - 10 reps - Clamshell with Resistance  - 1 x daily - 7 x weekly - 3 sets - 10 reps - Sidelying Reverse Clamshell with Resistance  - 1 x daily - 7 x weekly - 3 sets - 10 reps - Side Plank on Knees  - 1 x daily - 7 x weekly - 3 sets - 3-5 reps - 5-10 sec hold - Quadruped Hip Abduction with Resistance Loop  - 1 x daily - 7 x weekly - 3 sets - 10 reps - The Diver  - 1 x daily - 7 x weekly - 3 sets - 10 reps - Single Leg Stance  - 1 x daily - 7 x weekly - 3 sets - 10 reps - Bird Dog with Resistance  - 1 x daily - 7 x weekly - 3 sets - 10 reps - Beginner Inside Leg Lift   - 1 x daily - 7 x weekly - 3 sets - 10 reps - Sidelying Hip Abduction  - 1 x daily - 7 x weekly - 3 sets - 10 reps - Sidelying Hip Abduction on Wall  - 1 x daily - 7 x weekly - 3 sets - 10 reps - Swiss Ball March  - 1 x daily - 7 x weekly - 2 sets - 10 reps - Swiss Ball Knee Extension  - 1 x daily - 7 x weekly - 2 sets - 10 reps - Walking Hamstring Stretch  - 1 x daily - 7 x weekly - 1 sets - 10 reps - Dynamic Straight Leg Kicks  - 1 x daily - 7 x weekly - 1 sets - 10 reps - Walk with Ankle Grab and Heel Raise - Dynamic Warm Up  - 1 x daily - 7 x weekly - 1 sets  - 10 reps - Walk with Hip External Rotation  - 1 x daily - 7 x weekly - 1 sets - 10 reps - Standing Piriformis Stretch  - 1 x daily - 7 x weekly - 1 sets - 10 reps - Squat  - 1 x daily - 7 x weekly - 2 sets - 10 reps - Walking Forward Lunge  - 1 x daily - 7 x weekly - 2 sets - 10 reps - Standing Clam with Resistance Loop  - 1 x daily - 7 x weekly - 2 sets - 10 reps - Side Stepping with Resistance at Ankles  - 1 x daily - 7 x weekly - 2 sets - 10 reps - Backward Band Walks with Resistance at Thighs and Ankles  - 1 x daily - 7 x weekly - 2 sets - 10 reps - Forward Band Walks with Resistance at Thighs and Ankles  - 1 x daily - 7 x weekly - 2 sets - 10 reps  ASSESSMENT:  CLINICAL IMPRESSION: Incorporated dynamic standing stretching and strengthening today as patient reports over the weekends she is typically on her feet a lot and it's hard to incorporate her static stretching and mat based strengthening. She did well with strength progression in standing with exception of walking lunge as she felt pulling in the anterior Rt  hip and pinching about SIJ. HEP was updated to include variety of standing stretching/strengthening that she can better incorporate into her weekend routine.   OBJECTIVE IMPAIRMENTS: decreased activity tolerance, decreased ROM, decreased strength, impaired flexibility, and pain.   ACTIVITY LIMITATIONS: sitting and standing  PARTICIPATION LIMITATIONS: driving, community activity, and occupation  PERSONAL FACTORS: Past/current experiences and Time since onset of injury/illness/exacerbation are also affecting patient's functional outcome.   REHAB POTENTIAL: Good  CLINICAL DECISION MAKING: Stable/uncomplicated  EVALUATION COMPLEXITY: Low   GOALS: Goals reviewed with patient? Yes  SHORT TERM GOALS: Target date: 03/01/2023  Pt will be independent with initial HEP Baseline: Goal status: MET  2.  Pt will tolerate 50% Rt hip IR ROM with pain <= 1/10 Baseline:  Goal  status: MET    LONG TERM GOALS: Target date: 05/14/23  Pt will be independent with advanced HEP Baseline:  Goal status: ongoing   2.  Pt will improve FOTO to >= 64 to demo improved functional mobility Baseline:  Goal status: MET  3.  Pt will tolerate 75% ROM Rt hip IR and abd with pain <= 1/10 Baseline: 5/10 pain  Goal status: ongoing   4.  Pt will report getting up after sitting x 2 hours with pain <= 1/10 Baseline:  03/28/23: reports soreness when she stands after sitting Goal status: MET  5.  Pt will improve Rt hip strength to 4+/5 Baseline:  Goal status: MET  6. Patient will maintain symmetrical pelvic alignment between sessions.   Baseline: see above  Goal status: NEW    PLAN:  PT FREQUENCY: 1x/week  PT DURATION: 6 weeks  PLANNED INTERVENTIONS: Therapeutic exercises, Therapeutic activity, Neuromuscular re-education, Balance training, Gait training, Patient/Family education, Self Care, Joint mobilization, Aquatic Therapy, Dry Needling, Electrical stimulation, Cryotherapy, Moist heat, Taping, Ultrasound, Ionotophoresis 4mg /ml Dexamethasone, Manual therapy, and Re-evaluation  PLAN FOR NEXT SESSION: core/hip strengthening; hip mobility; TPDN as needed.    Letitia Libra, PT, DPT, ATC 05/02/23 8:43 AM

## 2023-05-09 ENCOUNTER — Ambulatory Visit: Payer: No Typology Code available for payment source

## 2023-05-09 DIAGNOSIS — M25551 Pain in right hip: Secondary | ICD-10-CM | POA: Diagnosis not present

## 2023-05-09 DIAGNOSIS — R6889 Other general symptoms and signs: Secondary | ICD-10-CM

## 2023-05-09 DIAGNOSIS — M6281 Muscle weakness (generalized): Secondary | ICD-10-CM

## 2023-05-09 NOTE — Therapy (Signed)
OUTPATIENT PHYSICAL THERAPY LOWER EXTREMITY TREATMENT PHYSICAL THERAPY DISCHARGE SUMMARY  Visits from Start of Care: 14  Current functional level related to goals / functional outcomes: See goals below   Remaining deficits: Intermittent bilateral hip pain   Education / Equipment: See education below    Patient agrees to discharge. Patient goals were partially met. Patient is being discharged due to  independent with HEP .    Patient Name: Sylvia Meyer MRN: [id number] DOB:May 16, 1971, 52 y.o., female Today's Date: 05/09/2023  END OF SESSION:  PT End of Session - 05/09/23 0805     Visit Number 14    Number of Visits 15    Date for PT Re-Evaluation 05/14/23    Authorization Type [employer]    Authorization Time Period 120 VISITS PER YEAR COMBINED WITH OT    PT Start Time 0805    PT Stop Time 0832    PT Time Calculation (min) 27 min    Activity Tolerance Patient tolerated treatment well    Behavior During Therapy WFL for tasks assessed/performed                  Past Medical History:  Diagnosis Date   Allergy    Anemia    Anxiety    Depression    Family history of breast cancer    Family history of colon cancer    Family history of colon cancer    Family history of prostate cancer 12/22/2022   History reviewed. No pertinent surgical history. Patient Active Problem List   Diagnosis Date Noted   Genetic testing 12/30/2022   Family history of breast cancer 12/22/2022   Family history of colon cancer 12/22/2022   Family history of prostate cancer 12/22/2022   Large breasts 10/26/2017   Chronic venous insufficiency 10/19/2017   Varicose veins of bilateral lower extremities with other complications 10/19/2017   Poor posture 09/14/2017   Nonallopathic lesion of cervical region 09/14/2017   Slipped rib syndrome 09/14/2017   Nonallopathic lesion of rib cage 09/14/2017   Nonallopathic lesion of thoracic region 09/14/2017   Encounter for general adult medical  examination with abnormal findings 08/30/2017   Allergic rhinitis 08/30/2017   Adjustment disorder with mixed anxiety and depressed mood 07/20/2015    PCP: Abbe Amsterdam  REFERRING PROVIDER: Abbe Amsterdam  REFERRING DIAG: Right hip pain  THERAPY DIAG:  Pain in right hip  Muscle weakness (generalized)  Decreased functional activity tolerance  Rationale for Evaluation and Treatment: Rehabilitation  ONSET DATE: June 2024  SUBJECTIVE:   SUBJECTIVE STATEMENT: "A little stiff today." She did her exercises all week and didn't have any pain. On Saturday she didn't do her HEP and had pain/stiffness. She feels that she can continue with her HEP independently and knows that this is what helps to keep her pain levels low.    PERTINENT HISTORY: Multiple falls PAIN:  Are you having pain? Yes: NPRS scale: 3/10 Pain location: bilateral hips (Rt>Lt) Pain description: stiff   Aggravating factors: stairs; yard work Relieving factors: none reported  PRECAUTIONS: None  RED FLAGS: None   WEIGHT BEARING RESTRICTIONS: No  FALLS:  Has patient fallen in last 6 months? Yes. 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)  PLOF: Independent  PATIENT GOALS: decrease pain  NEXT MD VISIT: PRN  OBJECTIVE:   DIAGNOSTIC FINDINGS: x ray: OA bilat hips  PATIENT SURVEYS:  FOTO 47  03/28/23: 75% function  SENSATION: WFL  MUSCLE LENGTH: Hamstrings: Right  108 deg; Left 108 deg Thomas test: negative bilat - Rt hip/sacrum pain with thomas test position  PALPATION: Hip jt mobility WFL lateral glides, inferior glides, anterior glides TTP RT great trochanter, Rt SIJ lateral border Increased mm spasticity Rt glutes  03/28/23: Rt ASIS and iliac crest elevated in standing  04/06/23: symmetrical pelvic alignment  LUMBAR ROM:   Active  A/PROM  03/28/23  Flexion Full   Extension WFL Tightness in anterior hips  Right lateral flexion WFL pinch Rt SIJ  Left  lateral flexion WFL  Right rotation [facility]  Left rotation WFL   (Blank rows = not tested)  LOWER EXTREMITY ROM:  Active ROM Right eval Left eval 03/14/23 03/28/23 05/09/23  Hip flexion WFL pain WFL  WFL bilateral  WFL bilateral   Hip extension       Hip abduction 75% pain 75%  25% limited Rt ; pain Rt 5/10 WFL bilateral; sore Rt 4/10  Hip adduction       Hip internal rotation 25% pain 25% Lt: Full and pain free passive; Rt 25% limited pain passive 4/10 pain (post manual 1/10 pain 50% limited) 25% limited Rt 3/10 pn on Rt WFL  Hip external rotation 75% 75% Lt: Full and pain free passive; Rt: 25% limited passive 4/10; (post manual no pain and full range)  WFL bilateral 1/0 pn on Rt  [facility]  Knee flexion       Knee extension       Ankle dorsiflexion       Ankle plantarflexion       Ankle inversion       Ankle eversion        (Blank rows = not tested)  LOWER EXTREMITY MMT:  MMT Right eval Left eval 03/28/23 05/09/23  Hip flexion 4 4 5  bilateral  5 bilateral   Hip extension 3- 4 5 bilateral  5 bilateral  Hip abduction 4  5 bilateral  5 bilateral   Hip adduction      Hip internal rotation   5 bilateral    Hip external rotation 4  5 bilateral   Knee flexion    5 bilateral  Knee extension    5 bilateral   Ankle dorsiflexion      Ankle plantarflexion      Ankle inversion      Ankle eversion       (Blank rows = not tested)  SPECIAL TESTS: 03/28/23:   (+) Long Sit (+) Thigh Thrust (-) FABER, sacral thrust, Gaenslen's    TODAY'S TREATMENT:        [facility] Adult PT Treatment:                                                DATE: 05/09/23 Therapeutic Exercise: Reviewed and updated HEP discussing frequency, sets, reps, and ways to progress independently. Demo and returning demo as needed.   Therapeutic Activity: Re-assessment to determine overall progress, educating patient on progress towards goals.    [facility] Adult PT Treatment:                                                DATE:  05/02/23 Therapeutic Exercise: Recumbent bike level 2 x 5 minutes  Dynamic stretching:  frankenstein's, walking quad stretch, walking hamstring stretch, rotational step overs fwd/bwd, walking figure 4  Squats x 10  Walking lunge 2 x 10 ft  Standing clamshells x 10 each  Lateral,forward,backward band walks 2 x 10 ft; black band shins  Leg press 2 x 10 @ 65 lbs  HEP update    [facility] Adult PT Treatment:                                                DATE: 04/25/23 Therapeutic Exercise: Recumbent bike level 2 x 5 minutes  LTR with figure 4 x 1 minute each  Lunge hip flexor stretch x 1 minute each  Pelvic tilts on physioball 2 x 10  Seated march on physioball 2 x 10  Seated knee extension on physioball 2 x 10  3 way hip airex pad x 10 each  Standing clamshell 2 x 10 Leg press with black band at thighs 2 x 10 @ 65 lbs  Updated HEP                                                                                                                      PATIENT EDUCATION:  Education details: see treatment; d/c education  Person educated: Patient Education method: Explanation, demo, cues, handout Education comprehension: verbalized understanding, returned demo  HOME EXERCISE PROGRAM: Access Code: [email or web address] URL: [email or web address] Date: 05/09/2023 Prepared by: Letitia Libra  Program Notes PICK 4-5 DAILY  Exercises - Half Kneeling Hip Flexor Stretch  - 1 x daily - 7 x weekly - 3 sets - 30 sec  hold - Pigeon Pose  - 1 x daily - 7 x weekly - 3 sets - 30 sec  hold - Supine Figure 4 Piriformis Stretch  - 2 x daily - 7 x weekly - 3 sets - 30  hold - Side Sitting  - 1 x daily - 7 x weekly - 1 sets - 10 reps - Prone Press Up  - 1 x daily - 7 x weekly - 1 sets - 10 reps - Bridge with Hip Abduction and Resistance  - 1 x daily - 7 x weekly - 3 sets - 10 reps - Single Leg Bridge  - 1 x daily - 7 x weekly - 3 sets - 10 reps - Clamshell with Resistance  - 1 x daily - 7 x weekly - 3  sets - 10 reps - Sidelying Reverse Clamshell with Resistance  - 1 x daily - 7 x weekly - 3 sets - 10 reps - Side Plank on Knees  - 1 x daily - 7 x weekly - 3 sets - 3-5 reps - 5-10 sec hold - Quadruped Hip Abduction with Resistance Loop  - 1 x daily - 7 x weekly - 3 sets - 10 reps - The Diver  - 1 x daily - 7 x weekly -  3 sets - 10 reps - Single Leg Stance  - 1 x daily - 7 x weekly - 3 sets - 30 sec  hold - Bird Dog with Resistance  - 1 x daily - 7 x weekly - 3 sets - 10 reps - Beginner Inside Leg Lift   - 1 x daily - 7 x weekly - 3 sets - 10 reps - Sidelying Hip Abduction on Wall  - 1 x daily - 7 x weekly - 3 sets - 10 reps - Swiss Ball March  - 1 x daily - 7 x weekly - 2 sets - 10 reps - Swiss Ball Knee Extension  - 1 x daily - 7 x weekly - 2 sets - 10 reps - Walking Hamstring Stretch  - 1 x daily - 7 x weekly - 1 sets - 10 reps - Dynamic Straight Leg Kicks  - 1 x daily - 7 x weekly - 1 sets - 10 reps - Walk with Ankle Grab and Heel Raise - Dynamic Warm Up  - 1 x daily - 7 x weekly - 1 sets - 10 reps - Walk with Hip External Rotation  - 1 x daily - 7 x weekly - 1 sets - 10 reps - Standing Piriformis Stretch  - 1 x daily - 7 x weekly - 1 sets - 10 reps - Walking Forward Lunge  - 1 x daily - 7 x weekly - 2 sets - 10 reps - Squat  - 1 x daily - 7 x weekly - 2 sets - 10 reps - Standing Clam with Resistance Loop  - 1 x daily - 7 x weekly - 2 sets - 10 reps - Side Stepping with Resistance at Ankles  - 1 x daily - 7 x weekly - 2 sets - 10 reps - Backward Band Walks with Resistance at Thighs and Ankles  - 1 x daily - 7 x weekly - 2 sets - 10 reps - Forward Band Walks with Resistance at Thighs and Ankles  - 1 x daily - 7 x weekly - 2 sets - 10 reps  ASSESSMENT:  CLINICAL IMPRESSION: Patient has progressed well in PT noting a significant improvement in her pain with consistency of performing HEP. She demonstrates improvements in hip mobility and strength and has met all established functional goals  with exception of hip ROM goal. Today's session focused on finalizing her HEP discussing frequency, sets, reps, and ways to progress independently with patient verbalizing understanding. She feels that she can continue her HEP independently and is appropriate for discharge at this time with patient in agreement with this plan.   OBJECTIVE IMPAIRMENTS: decreased activity tolerance, decreased ROM, decreased strength, impaired flexibility, and pain.   ACTIVITY LIMITATIONS: sitting and standing  PARTICIPATION LIMITATIONS: driving, community activity, and occupation  PERSONAL FACTORS: Past/current experiences and Time since onset of injury/illness/exacerbation are also affecting patient's functional outcome.   REHAB POTENTIAL: Good  CLINICAL DECISION MAKING: Stable/uncomplicated  EVALUATION COMPLEXITY: Low   GOALS: Goals reviewed with patient? Yes  SHORT TERM GOALS: Target date: 03/01/2023  Pt will be independent with initial HEP Baseline: Goal status: MET  2.  Pt will tolerate 50% Rt hip IR ROM with pain <= 1/10 Baseline:  Goal status: MET    LONG TERM GOALS: Target date: 05/14/23  Pt will be independent with advanced HEP Baseline:  Goal status: met   2.  Pt will improve FOTO to >= 64 to demo improved functional mobility Baseline:  Goal  status: MET  3.  Pt will tolerate 75% ROM Rt hip IR and abd with pain <= 1/10 Baseline: 5/10 pain  Goal status: partially met    4.  Pt will report getting up after sitting x 2 hours with pain <= 1/10 Baseline:  03/28/23: reports soreness when she stands after sitting Goal status: MET  5.  Pt will improve Rt hip strength to 4+/5 Baseline:  Goal status: MET  6. Patient will maintain symmetrical pelvic alignment between sessions.   Baseline: see above  Goal status: MET    PLAN:  PT FREQUENCY: 1x/week  PT DURATION: 6 weeks  PLANNED INTERVENTIONS: Therapeutic exercises, Therapeutic activity, Neuromuscular re-education, Balance  training, Gait training, Patient/Family education, Self Care, Joint mobilization, Aquatic Therapy, Dry Needling, Electrical stimulation, Cryotherapy, Moist heat, Taping, Ultrasound, Ionotophoresis 4mg /ml Dexamethasone, Manual therapy, and Re-evaluation  PLAN FOR NEXT SESSION: n/a d/c    Letitia Libra, PT, DPT, ATC 05/09/23 8:35 AM

## 2023-08-17 ENCOUNTER — Encounter: Payer: Self-pay | Admitting: Obstetrics and Gynecology

## 2023-08-17 ENCOUNTER — Ambulatory Visit (INDEPENDENT_AMBULATORY_CARE_PROVIDER_SITE_OTHER): Payer: No Typology Code available for payment source | Admitting: Obstetrics and Gynecology

## 2023-08-17 VITALS — BP 100/64 | HR 66 | Ht 64.75 in | Wt 173.0 lb

## 2023-08-17 DIAGNOSIS — N958 Other specified menopausal and perimenopausal disorders: Secondary | ICD-10-CM | POA: Diagnosis not present

## 2023-08-17 DIAGNOSIS — Z01419 Encounter for gynecological examination (general) (routine) without abnormal findings: Secondary | ICD-10-CM | POA: Diagnosis not present

## 2023-08-17 DIAGNOSIS — Z803 Family history of malignant neoplasm of breast: Secondary | ICD-10-CM | POA: Diagnosis not present

## 2023-08-17 MED ORDER — ESTRADIOL 0.1 MG/GM VA CREA: TOPICAL_CREAM | VAGINAL | 11 refills | Status: AC

## 2023-08-17 NOTE — Progress Notes (Signed)
 ANNUAL EXAM Patient name: Sylvia Meyer MRN [id number]  Date of birth: 12-27-1970 Chief Complaint:   Annual Exam  History of Present Illness:   Sylvia Meyer is a 53 y.o. menopausal [phone] being seen today for a routine annual exam.   Current complaints:  Taking HRT intermittently. Will go a few days without her estrogen patch. Only takes prometrium on the days she has an estrogen patch on, but makes sure to always take the prometrium.   Notes back in back/hips that started around 2 years ago. 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 Did 10 weeks of PT with improvement but notes she needs to stay on top of the exercises to keep the pain away  Previously had a lot of issues with vaginal discharge, discomfort, and dryness. Sometimes has stabbing pain. Also notes intermittent pelvic discomfort that will feel like a UTI  Last pap 08/06/22. Results were: ASCUS w/ HRHPV negative. H/O abnormal pap: yes Last mammogram: 07/12/22. Results were: normal. Family h/o breast cancer: yes mother at age 44, paternal aunt Last colonoscopy: 2022. Results were: normal. Family h/o colorectal cancer: yes paternal grandmother     01/31/2023    4:37 PM 08/06/2022    1:38 PM 05/20/2021    8:34 AM 02/28/2020   10:42 AM 08/30/2017    9:50 AM  Depression screen PHQ 2/9  Decreased Interest 0 0 0 0 0  Down, Depressed, Hopeless 0 0 0 0 0  PHQ - 2 Score 0 0 0 0 0  Altered sleeping 0 1 0 0   Tired, decreased energy 0 1 0 1   Change in appetite 0 0 0 0   Feeling bad or failure about yourself  0 0 0 0   Trouble concentrating 0 1 0 0   Moving slowly or fidgety/restless 0 0 0 0   Suicidal thoughts 0 0 0 0   PHQ-9 Score 0 3 0 1   Difficult doing work/chores  Somewhat difficult  Not difficult at all         01/31/2023    4:37 PM 08/06/2022    1:39 PM 02/28/2020   10:43 AM  GAD 7 : Generalized Anxiety Score  Nervous, Anxious, on Edge 0 1 1   Control/stop worrying 0 0 1  Worry too much - different things 0 1 1  Trouble relaxing 0 1 1  Restless 0 0 0  Easily annoyed or irritable 0 0 0  Afraid - awful might happen 0 0 0  Total GAD 7 Score 0 3 4  Anxiety Difficulty  Somewhat difficult Somewhat difficult     Review of Systems:   Pertinent items are noted in HPI Denies any headaches, blurred vision, fatigue, shortness of breath, chest pain, abdominal pain, abnormal vaginal discharge/itching/odor/irritation, problems with periods, bowel movements, urination, or intercourse unless otherwise stated above. Pertinent History Reviewed:  Reviewed past medical,surgical, social and family history.  Reviewed problem list, medications and allergies. Physical Assessment:   Vitals:   08/17/23 0918  BP: 100/64  Pulse: 66  Weight: 173 lb (78.5 kg)  Height: 5' 4.75" (1.645 m)  Body mass index is 29.01 kg/m.        Physical Examination:   General appearance - well appearing, and in no distress  Mental status - alert, oriented to person, place, and time  Chest - respiratory effort normal  Heart - normal peripheral perfusion  Breasts - breasts appear normal, no suspicious masses, no skin or nipple changes or axillary nodes  Abdomen - soft, nontender, nondistended, no masses or organomegaly  Pelvic - VULVA: normal appearing vulva with no masses, tenderness or lesions  VAGINA: normal appearing vagina with normal color and discharge, no lesions  CERVIX: normal texture, no mass or tenderness  UTERUS: uterus is felt to be normal size, shape, consistency and nontender   ADNEXA: No adnexal masses or tenderness noted.  Chaperone present for exam  No results found for this or any previous visit (from the past 24 hours).  Assessment & Plan:  1) Well-Woman Exam Mammogram: scheduled 09/28/23 Colonoscopy: per GI Pap: Due 2027 Declines flu shot today  2) Family history of breast and colon cancer S/p genetic testing -  CancerNext-Expanded+RNAinsight panel negative  3) Genitourinary syndrome of menopause Discussed option to try vaginal estrogen given dryness seems to be chief concern/most bothersome symptom. Discussed using pea sized amount nightly x 2 weeks then twice weekly thereafter She will come off menopausal HT for now, but discussed option to resume if hot flashes return/become more bothersome  Labs/procedures today:   No orders of the defined types were placed in this encounter.  Meds:  Meds ordered this encounter  Medications   estradiol (ESTRACE VAGINAL) 0.1 MG/GM vaginal cream    Sig: Apply a pea sized amount to the inside & opening of the vagina nightly for two weeks, then twice weekly afterwards    Dispense:  42.5 g    Refill:  11   Follow-up: Return in about 1 year (around 08/16/2024) for annual exam or sooner as needed.  Lennart Pall, MD 08/17/2023 10:15 AM

## 2023-09-08 ENCOUNTER — Encounter: Payer: Self-pay | Admitting: Genetic Counselor

## 2023-09-08 NOTE — Progress Notes (Signed)
 UPDATE: NTHL1 p.L23P (c.68T>C)VUS was reclassified to Likely Benign. The amended report date is September 06, 2023.

## 2023-09-19 ENCOUNTER — Other Ambulatory Visit: Payer: Self-pay | Admitting: Obstetrics and Gynecology

## 2023-09-19 DIAGNOSIS — Z1231 Encounter for screening mammogram for malignant neoplasm of breast: Secondary | ICD-10-CM

## 2023-09-28 ENCOUNTER — Ambulatory Visit: Payer: No Typology Code available for payment source

## 2023-09-28 DIAGNOSIS — Z1231 Encounter for screening mammogram for malignant neoplasm of breast: Secondary | ICD-10-CM | POA: Diagnosis not present

## 2023-10-01 ENCOUNTER — Encounter: Payer: Self-pay | Admitting: Obstetrics and Gynecology

## 2023-12-06 ENCOUNTER — Encounter: Payer: Self-pay | Admitting: Obstetrics and Gynecology

## 2023-12-14 ENCOUNTER — Telehealth: Admitting: Obstetrics and Gynecology

## 2023-12-14 DIAGNOSIS — F4323 Adjustment disorder with mixed anxiety and depressed mood: Secondary | ICD-10-CM

## 2023-12-14 DIAGNOSIS — M25559 Pain in unspecified hip: Secondary | ICD-10-CM | POA: Diagnosis not present

## 2023-12-14 DIAGNOSIS — R6882 Decreased libido: Secondary | ICD-10-CM

## 2023-12-14 DIAGNOSIS — N958 Other specified menopausal and perimenopausal disorders: Secondary | ICD-10-CM | POA: Diagnosis not present

## 2023-12-14 NOTE — Progress Notes (Signed)
   TELEHEALTH GYNECOLOGY VISIT ENCOUNTER NOTE  Provider location: [facility] [place]   Patient location: Work  I connected with Sylvia Meyer on 12/14/23 at  4:10 PM EDT by MyChart audiovisual encounter  History:  Sylvia Meyer is a 52 y.o. menopausal [phone] presenting for discussion of menopause symptoms.   Saw patient ~ 4 months ago. Stopped MHT in favor of just doing vaginal estrogen, but hot flashes came back so she's restarted her estradiol  patch and prometrium . Stopped the estrogen cream because she wasn't sure if it was safe with the systemic therapy.   Low libido - was having pain with IC due to dryness.. Estrogen cream did help with that but her libido did not improve. She notes that her partner is great & very understanding. Notes longstanding history of anxiety, was previously on wellbutrin  but stopped about 3 weeks ago. Her anxiety had spiked about a year ago and she went back to seeing a therapist. Stopping the wellbutrin  didn't seem to have an effect on her anxiety, but in the past when she's restarted it she's felt sad initially. She also has a stressful job.    Hip pain - has been to PT, if she is consistent with exercise/stretches they help  Wants to learn more about vitamin D  supplementation and make sure it is OK to take magnesium at night for RLS/sleep.    Past Medical History:  Diagnosis Date   Allergy    Anemia    Anxiety    Depression    Family history of breast cancer    Family history of colon cancer    Family history of colon cancer    Family history of prostate cancer 12/22/2022   No past surgical history on file. The following portions of the patient's history were reviewed and updated as appropriate: allergies, current medications, past family history, past medical history, past social history, past surgical history and problem list.    Review of Systems:  Pertinent items noted in HPI and remainder of comprehensive  ROS otherwise negative.  Physical Exam:   General:  Alert, oriented and cooperative.   Mental Status: Normal mood and affect perceived. Normal judgment and thought content.  Physical exam deferred due to nature of the encounter  Labs and Imaging No results found for this or any previous visit (from the past 2 weeks). No results found.    Assessment and Plan:  Diagnoses and all orders for this visit:  Genitourinary syndrome of menopause Low libido Adjustment disorder with mixed anxiety and depressed mood - Continue MHT and resume vaginal estrogen - Discussed multi-prong approach to libido concerns. Reviewed role of wellbutrin , mood, pain, etc. Given her mood concerns, discussed potential of restarting wellbutrin  and even talking to PCP about a dose increase - Discussed she is likely getting enough vitamin D  through diet/multivitamin, but goal is for 600-800 international units  daily - OK for magnesium supplement  Hip pain, unspecified laterality - Continue maintenance exercises/stretching  I discussed the assessment and treatment plan with the patient. The patient was provided an opportunity to ask questions and all were answered. The patient agreed with the plan and demonstrated an understanding of the instructions.  I provided 27 minutes of non-face-to-face time during this encounter.  Kieth JAYSON Carolin, MD [facility] [employer], [facility]

## 2023-12-28 ENCOUNTER — Other Ambulatory Visit: Payer: Self-pay | Admitting: Medical Genetics

## 2024-01-04 ENCOUNTER — Other Ambulatory Visit (HOSPITAL_COMMUNITY)
Admission: RE | Admit: 2024-01-04 | Discharge: 2024-01-04 | Disposition: A | Discharge status: Home / Self Care | Payer: Self-pay | Source: Ambulatory Visit | Attending: Medical Genetics | Admitting: Medical Genetics

## 2024-01-04 DIAGNOSIS — Z006 Encounter for examination for normal comparison and control in clinical research program: Secondary | ICD-10-CM | POA: Insufficient documentation

## 2024-01-09 ENCOUNTER — Other Ambulatory Visit: Payer: Self-pay | Admitting: Medical Genetics

## 2024-01-09 DIAGNOSIS — Z006 Encounter for examination for normal comparison and control in clinical research program: Secondary | ICD-10-CM

## 2024-01-15 LAB — GENECONNECT MOLECULAR SCREEN: Genetic Analysis Overall Interpretation: NEGATIVE

## 2024-01-21 ENCOUNTER — Encounter: Payer: Self-pay | Admitting: Obstetrics and Gynecology

## 2024-01-24 ENCOUNTER — Other Ambulatory Visit: Payer: Self-pay

## 2024-01-24 DIAGNOSIS — R6882 Decreased libido: Secondary | ICD-10-CM

## 2024-01-24 MED ORDER — PROGESTERONE MICRONIZED 100 MG PO CAPS: 100.0000 mg | ORAL_CAPSULE | Freq: Every evening | ORAL | 2 refills | Status: AC

## 2024-01-24 MED ORDER — ESTRADIOL 0.05 MG/24HR TD PTTW: 1.0000 | MEDICATED_PATCH | TRANSDERMAL | 2 refills | Status: AC

## 2024-04-20 ENCOUNTER — Other Ambulatory Visit: Payer: Self-pay | Admitting: Obstetrics and Gynecology

## 2024-04-20 DIAGNOSIS — R6882 Decreased libido: Secondary | ICD-10-CM

## 2024-06-27 ENCOUNTER — Other Ambulatory Visit: Payer: Self-pay | Admitting: Family Medicine

## 2024-06-27 DIAGNOSIS — F32A Depression, unspecified: Secondary | ICD-10-CM

## 2024-06-29 ENCOUNTER — Other Ambulatory Visit: Payer: Self-pay

## 2024-06-29 DIAGNOSIS — F419 Anxiety disorder, unspecified: Secondary | ICD-10-CM

## 2024-06-29 MED ORDER — BUPROPION HCL ER (XL) 150 MG PO TB24: 150.0000 mg | ORAL_TABLET | Freq: Every day | ORAL | 0 refills | Status: AC

## 2024-06-29 NOTE — Telephone Encounter (Signed)
 Patient ha been rech 1/26 and 30 days sent to pharm

## 2024-07-09 ENCOUNTER — Encounter: Admitting: Family Medicine

## 2024-08-17 ENCOUNTER — Encounter: Admitting: Family Medicine
